# Patient Record
Sex: Female | Born: 1995 | Hispanic: Yes | Marital: Married | Smoking: Never smoker
Health system: Southern US, Community
[De-identification: ages and names within clinical notes are randomized; demographics above are authoritative.]

## PROBLEM LIST (undated history)

## (undated) HISTORY — PX: OTHER SURGICAL HISTORY: SHX169

---

## 2019-02-15 DIAGNOSIS — O039 Complete or unspecified spontaneous abortion without complication: Secondary | ICD-10-CM

## 2019-02-15 HISTORY — DX: Complete or unspecified spontaneous abortion without complication: O03.9

## 2019-03-02 ENCOUNTER — Other Ambulatory Visit: Payer: Self-pay

## 2019-03-02 DIAGNOSIS — Z20822 Contact with and (suspected) exposure to covid-19: Secondary | ICD-10-CM

## 2019-03-04 LAB — NOVEL CORONAVIRUS, NAA: SARS-CoV-2, NAA: NOT DETECTED

## 2021-03-09 ENCOUNTER — Other Ambulatory Visit: Payer: Self-pay

## 2021-03-09 ENCOUNTER — Emergency Department: Payer: Self-pay

## 2021-03-09 ENCOUNTER — Emergency Department
Admission: EM | Admit: 2021-03-09 | Discharge: 2021-03-09 | Disposition: A | Discharge status: Home / Self Care | Payer: Self-pay | Attending: Emergency Medicine | Admitting: Emergency Medicine

## 2021-03-09 DIAGNOSIS — Z3A01 Less than 8 weeks gestation of pregnancy: Secondary | ICD-10-CM | POA: Insufficient documentation

## 2021-03-09 DIAGNOSIS — O200 Threatened abortion: Secondary | ICD-10-CM | POA: Insufficient documentation

## 2021-03-09 LAB — CBC
HCT: 39.1 % (ref 36.0–46.0)
Hemoglobin: 13.6 g/dL (ref 12.0–15.0)
MCH: 30.4 pg (ref 26.0–34.0)
MCHC: 34.8 g/dL (ref 30.0–36.0)
MCV: 87.3 fL (ref 80.0–100.0)
Platelets: 326 10*3/uL (ref 150–400)
RBC: 4.48 MIL/uL (ref 3.87–5.11)
RDW: 13.3 % (ref 11.5–15.5)
WBC: 14.3 10*3/uL — ABNORMAL HIGH (ref 4.0–10.5)
nRBC: 0 % (ref 0.0–0.2)

## 2021-03-09 LAB — ABO/RH: ABO/RH(D): O POS

## 2021-03-09 LAB — HCG, QUANTITATIVE, PREGNANCY: hCG, Beta Chain, Quant, S: 48844 m[IU]/mL — ABNORMAL HIGH (ref <5)

## 2021-03-09 LAB — POC URINE PREG, ED: Preg Test, Ur: POSITIVE — AB

## 2021-03-09 IMAGING — US US OB COMP LESS 14 WK
1 series · 15 of 28 positions shown · non-contrast
Comparison: None.

CLINICAL DATA: Vaginal bleeding, serum beta hCG of 48,844

EXAM:
OBSTETRIC <14 WK ULTRASOUND
TECHNIQUE: Transabdominal ultrasound was performed for evaluation of the
gestation as well as the maternal uterus and adnexal regions.

[Series 1: us ob comp less 14 wks · 15 of 40 slices shown]
[im 1/40]
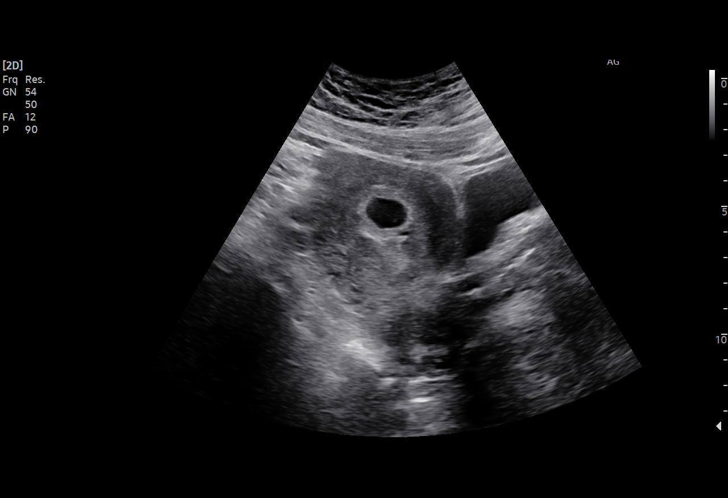
[im 3/40]
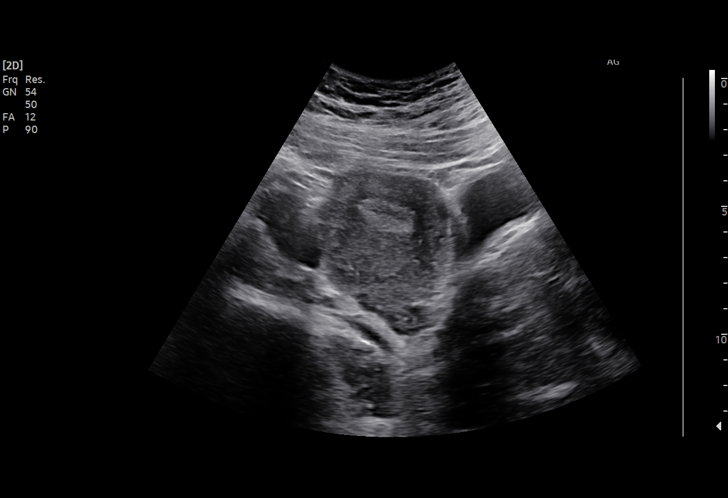
[im 6/40]
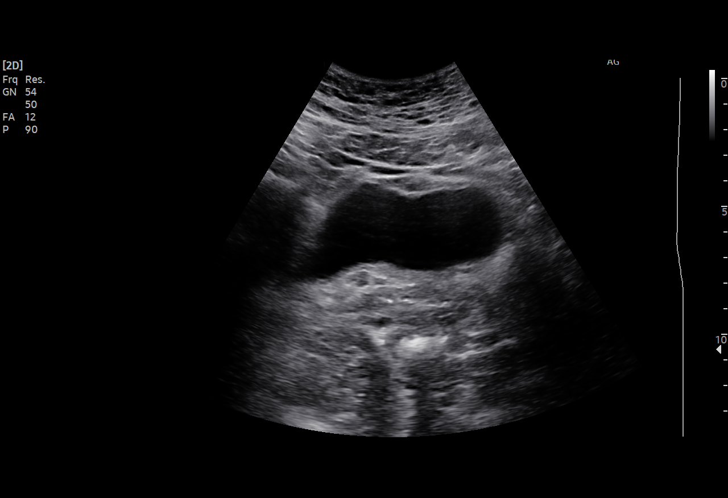
[im 9/40]
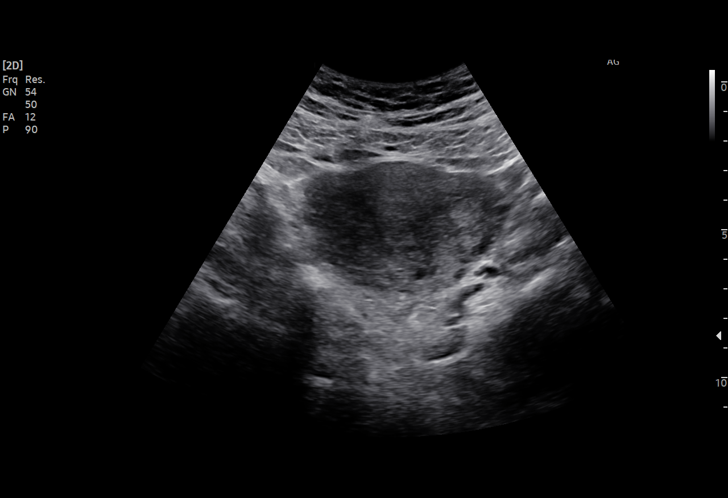
[im 12/40]
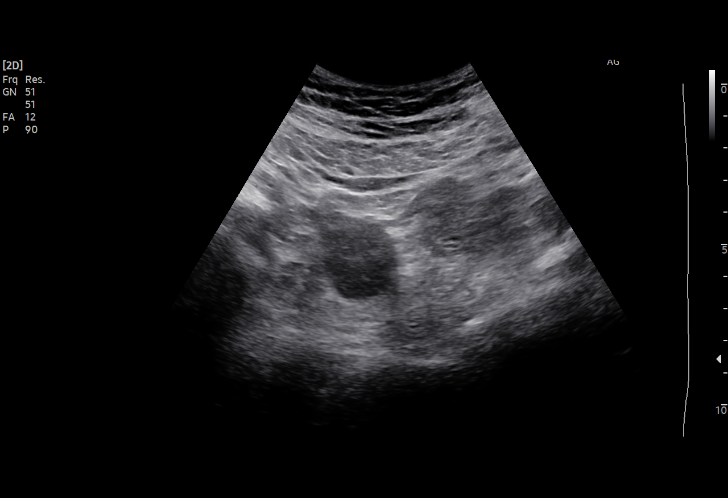
[im 15/40]
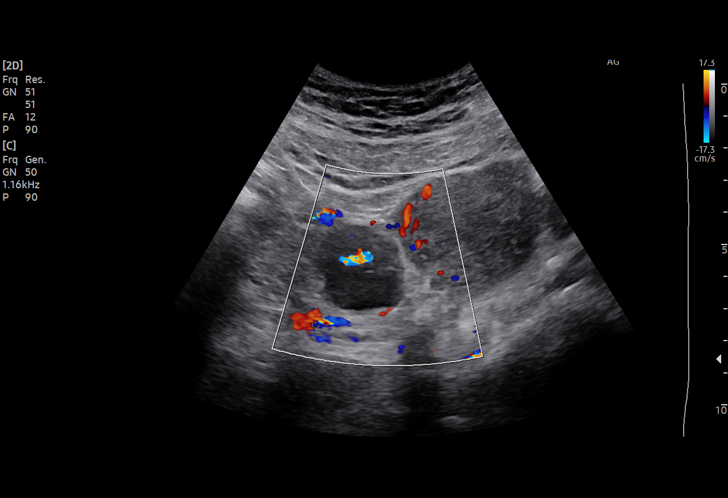
[im 18/40]
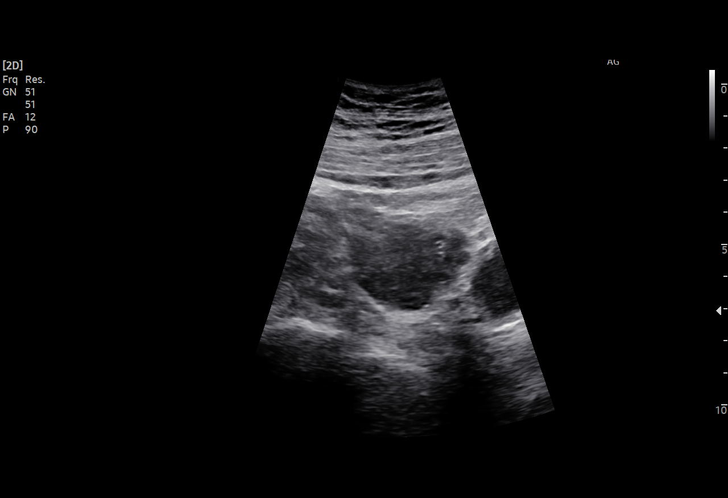
[im 21/40]
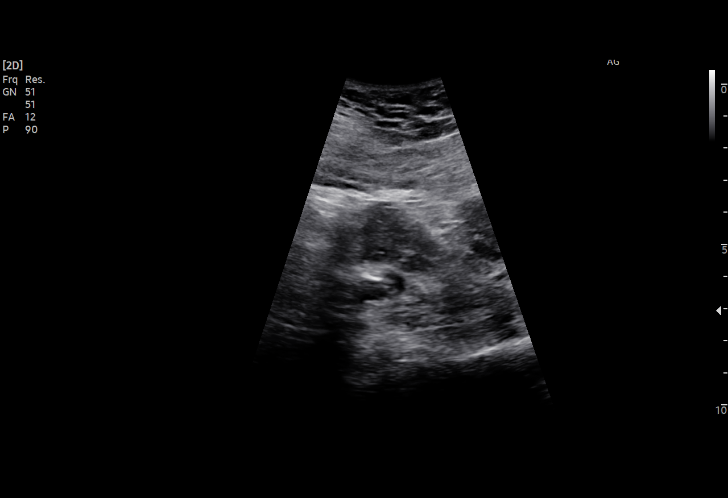
[im 22/40]
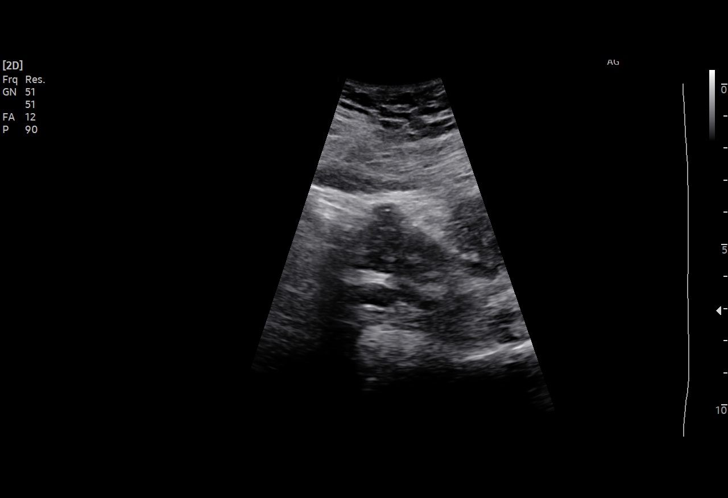
[im 25/40]
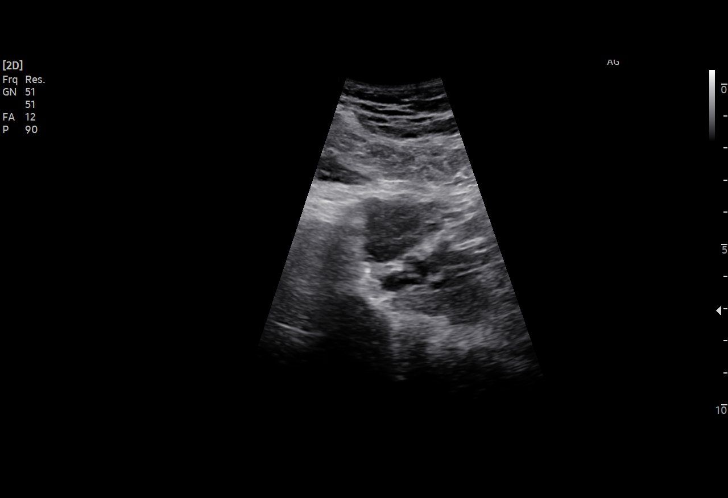
[im 28/40]
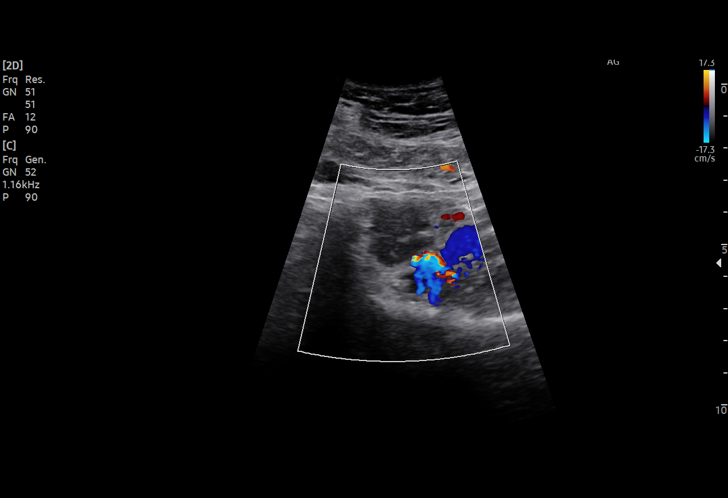
[im 31/40]
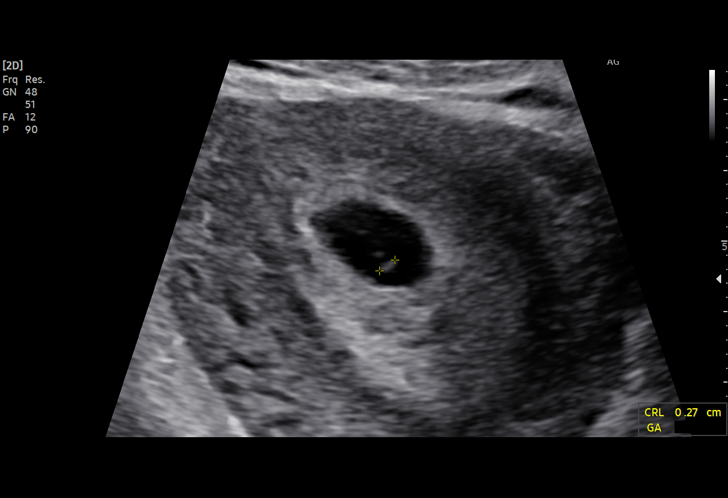
[im 34/40]
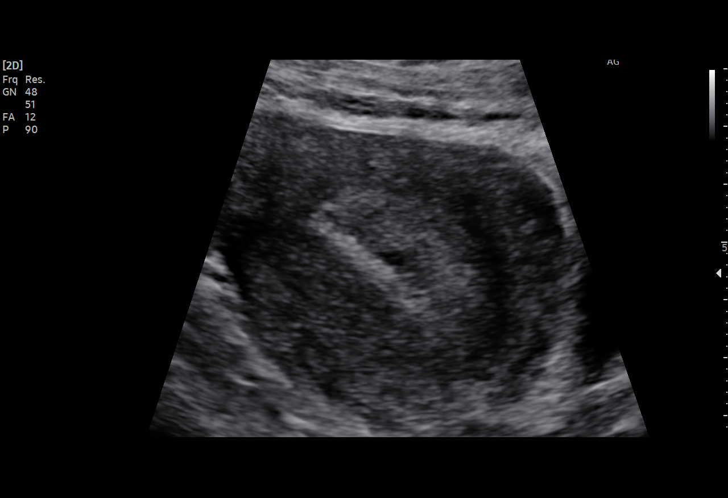
[im 37/40]
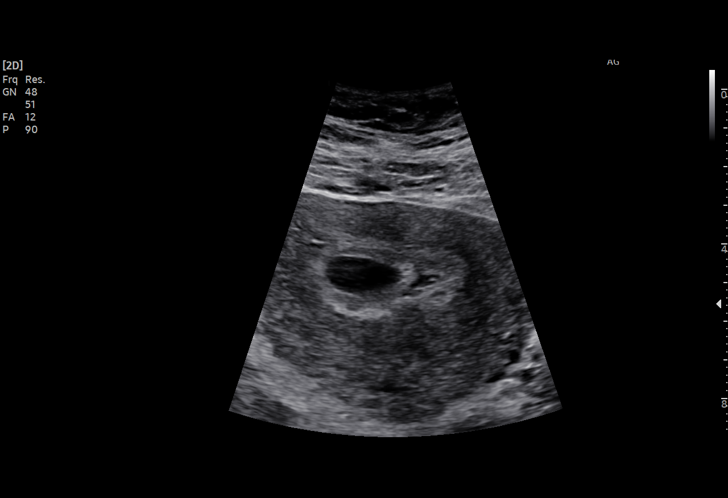
[im 40/40]
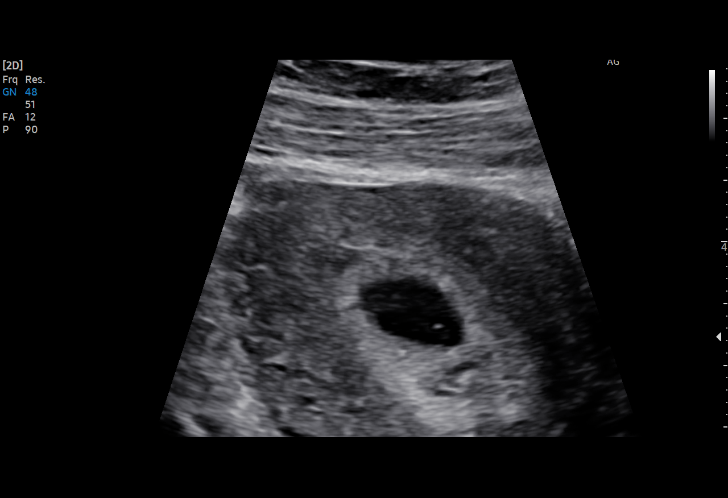

[15 of 28 positions shown; findings below may reference images not displayed]

FINDINGS: Intrauterine gestational sac: Single

Yolk sac:  Visualized.

Embryo:  Visualized.

Cardiac Activity: Visualized.

Heart Rate: 130 bpm

CRL:   3 mm   5 w 6 d                  US EDC: [DATE]

Subchorionic hemorrhage:  Small bowel.

Maternal uterus/adnexae: Bilateral ovaries are unremarkable. No
pelvic free fluid.
IMPRESSION: Single viable early intrauterine pregnancy with small volume
subchorionic hemorrhage.

## 2021-03-09 NOTE — ED Triage Notes (Signed)
Pt comes with c/o vaginal bleeding and cramping. Pt states positive preg test at home. Pt stats light bleeding pt unsure how far along.

## 2021-03-09 NOTE — ED Provider Notes (Signed)
[facility] Emergency Department Provider Note   ____________________________________________   None    (approximate)  I have reviewed the triage vital signs and the nursing notes.   HISTORY  Chief Complaint Vaginal Bleeding    HPI Leslie Powell is a 25 y.o. female with no significant past medical history who presents to the ED complaining of vaginal bleeding.  Patient recently found out she is pregnant and she is not sure how far along she has.  She developed cramping in her lower abdomen last night, this morning started to notice some vaginal bleeding.  She describes the bleeding as light spotting and the cramping seems to have improved from last night.  She complains of mild nausea, but has not had any vomiting or changes in bowel movements.  She denies any discharge, dysuria, fever, or flank pain.  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  She has her first OB/GYN appointment scheduled for next week at the [place] [facility].        History reviewed. No pertinent past medical history.  There are no problems to display for this patient.   History reviewed. No pertinent surgical history.  Prior to Admission medications   Not on File    Allergies Patient has no allergy information on record.  No family history on file.  Social History    Review of Systems  Constitutional: No fever/chills Eyes: No visual changes. ENT: No sore throat. Cardiovascular: Denies chest pain. Respiratory: Denies shortness of breath. Gastrointestinal: Positive for abdominal pain and nausea, no vomiting.  No diarrhea.  No constipation. Genitourinary: Negative for dysuria.  Positive for vaginal bleeding. Musculoskeletal: Negative for back pain. Skin: Negative for rash. Neurological: Negative for headaches, focal weakness or numbness.  ____________________________________________   PHYSICAL EXAM:  VITAL SIGNS: ED  Triage Vitals  Enc Vitals Group     BP 03/09/21 1021 133/83     Pulse Rate 03/09/21 1021 81     Resp 03/09/21 1021 18     Temp 03/09/21 1021 97.7 F (36.5 C)     Temp Source 03/09/21 1021 Oral     SpO2 03/09/21 1021 98 %     Weight --      Height --      Head Circumference --      Peak Flow --      Pain Score 03/09/21 1020 4     Pain Loc --      Pain Edu? --      Excl. in GC? --     Constitutional: Alert and oriented. Eyes: Conjunctivae are normal. Head: Atraumatic. Nose: No congestion/rhinnorhea. Mouth/Throat: Mucous membranes are moist. Neck: Normal ROM Cardiovascular: Normal rate, regular rhythm. Grossly normal heart sounds.  2+ radial pulses bilaterally. Respiratory: Normal respiratory effort.  No retractions. Lungs CTAB. Gastrointestinal: Soft and nontender. No distention. Genitourinary: deferred Musculoskeletal: No lower extremity tenderness nor edema. Neurologic:  Normal speech and language. No gross focal neurologic deficits are appreciated. Skin:  Skin is warm, dry and intact. No rash noted. Psychiatric: Mood and affect are normal. Speech and behavior are normal.  ____________________________________________   LABS (all labs ordered are listed, but only abnormal results are displayed)  Labs Reviewed  CBC - Abnormal; Notable for the following components:      Result Value   WBC 14.3 (*)    All other components within normal limits  HCG, QUANTITATIVE, PREGNANCY - Abnormal; Notable for the following components:  hCG, Beta Chain, Quant, [place] 10,258 (*)    All other components within normal limits  POC URINE PREG, ED - Abnormal; Notable for the following components:   Preg Test, Ur POSITIVE (*)    All other components within normal limits  ABO/RH    PROCEDURES  Procedure(s) performed (including Critical Care):  Procedures   ____________________________________________   INITIAL IMPRESSION / ASSESSMENT AND PLAN / ED COURSE      25 year old female  with no significant past medical history presents to the ED complaining of pelvic cramping and vaginal bleeding developing overnight into this morning.  Pregnancy testing is positive and ultrasound shows intrauterine pregnancy at 5 weeks and 6 days with small subchorionic hemorrhage.  H&H is stable and patient describes bleeding as mild currently, she is Rh+ and there is no indication for RhoGAM.  She has no abdominal tenderness currently.  Given reassuring work-up, she is appropriate for discharge home with OB/GYN follow-up.  She was counseled to return to the ED for new worsening symptoms, patient agrees with plan.      ____________________________________________   FINAL CLINICAL IMPRESSION(S) / ED DIAGNOSES  Final diagnoses:  Threatened miscarriage     ED Discharge Orders     None        Note:  This document was prepared using Dragon voice recognition software and may include unintentional dictation errors.    Chesley Noon, MD 03/09/21 1352

## 2021-04-18 ENCOUNTER — Emergency Department
Admission: EM | Admit: 2021-04-18 | Discharge: 2021-04-18 | Disposition: A | Discharge status: Home / Self Care | Payer: Self-pay | Attending: Emergency Medicine | Admitting: Emergency Medicine

## 2021-04-18 ENCOUNTER — Other Ambulatory Visit: Payer: Self-pay

## 2021-04-18 ENCOUNTER — Emergency Department: Payer: Self-pay

## 2021-04-18 DIAGNOSIS — E876 Hypokalemia: Secondary | ICD-10-CM | POA: Insufficient documentation

## 2021-04-18 DIAGNOSIS — M549 Dorsalgia, unspecified: Secondary | ICD-10-CM

## 2021-04-18 DIAGNOSIS — Z3A12 12 weeks gestation of pregnancy: Secondary | ICD-10-CM | POA: Insufficient documentation

## 2021-04-18 DIAGNOSIS — R109 Unspecified abdominal pain: Secondary | ICD-10-CM | POA: Insufficient documentation

## 2021-04-18 DIAGNOSIS — Z20822 Contact with and (suspected) exposure to covid-19: Secondary | ICD-10-CM | POA: Insufficient documentation

## 2021-04-18 DIAGNOSIS — R079 Chest pain, unspecified: Secondary | ICD-10-CM | POA: Insufficient documentation

## 2021-04-18 DIAGNOSIS — R0602 Shortness of breath: Secondary | ICD-10-CM | POA: Insufficient documentation

## 2021-04-18 DIAGNOSIS — O2691 Pregnancy related conditions, unspecified, first trimester: Secondary | ICD-10-CM | POA: Insufficient documentation

## 2021-04-18 DIAGNOSIS — M545 Low back pain, unspecified: Secondary | ICD-10-CM | POA: Insufficient documentation

## 2021-04-18 LAB — RESP PANEL BY RT-PCR (FLU A&B, COVID) ARPGX2
Influenza A by PCR: NEGATIVE
Influenza B by PCR: NEGATIVE
SARS Coronavirus 2 by RT PCR: NEGATIVE

## 2021-04-18 LAB — BASIC METABOLIC PANEL
Anion gap: 10 (ref 5–15)
BUN: 9 mg/dL (ref 6–20)
CO2: 20 mmol/L — ABNORMAL LOW (ref 22–32)
Calcium: 9.1 mg/dL (ref 8.9–10.3)
Chloride: 104 mmol/L (ref 98–111)
Creatinine, Ser: 0.42 mg/dL — ABNORMAL LOW (ref 0.44–1.00)
GFR, Estimated: >60 mL/min (ref >60)
Glucose, Bld: 116 mg/dL — ABNORMAL HIGH (ref 70–99)
Potassium: 3.4 mmol/L — ABNORMAL LOW (ref 3.5–5.1)
Sodium: 134 mmol/L — ABNORMAL LOW (ref 135–145)

## 2021-04-18 LAB — TROPONIN I (HIGH SENSITIVITY): Troponin I (High Sensitivity): <2 ng/L (ref <18)

## 2021-04-18 LAB — URINALYSIS, COMPLETE (UACMP) WITH MICROSCOPIC
Bacteria, UA: NONE SEEN
Bilirubin Urine: NEGATIVE
Glucose, UA: NEGATIVE mg/dL
Hgb urine dipstick: NEGATIVE
Ketones, ur: NEGATIVE mg/dL
Leukocytes,Ua: NEGATIVE
Nitrite: NEGATIVE
Protein, ur: NEGATIVE mg/dL
Specific Gravity, Urine: 1.004 — ABNORMAL LOW (ref 1.005–1.030)
pH: 9 — ABNORMAL HIGH (ref 5.0–8.0)

## 2021-04-18 LAB — CBC
HCT: 37.5 % (ref 36.0–46.0)
Hemoglobin: 12.7 g/dL (ref 12.0–15.0)
MCH: 29.3 pg (ref 26.0–34.0)
MCHC: 33.9 g/dL (ref 30.0–36.0)
MCV: 86.4 fL (ref 80.0–100.0)
Platelets: 316 10*3/uL (ref 150–400)
RBC: 4.34 MIL/uL (ref 3.87–5.11)
RDW: 13.3 % (ref 11.5–15.5)
WBC: 17.5 10*3/uL — ABNORMAL HIGH (ref 4.0–10.5)
nRBC: 0 % (ref 0.0–0.2)

## 2021-04-18 LAB — PROCALCITONIN: Procalcitonin: <0.1 ng/mL

## 2021-04-18 LAB — HEPATIC FUNCTION PANEL
ALT: 23 U/L (ref 0–44)
AST: 28 U/L (ref 15–41)
Albumin: 3.5 g/dL (ref 3.5–5.0)
Alkaline Phosphatase: 45 U/L (ref 38–126)
Bilirubin, Direct: 0.1 mg/dL (ref 0.0–0.2)
Indirect Bilirubin: 0.5 mg/dL (ref 0.3–0.9)
Total Bilirubin: 0.6 mg/dL (ref 0.3–1.2)
Total Protein: 6.9 g/dL (ref 6.5–8.1)

## 2021-04-18 LAB — LIPASE, BLOOD: Lipase: 25 U/L (ref 11–51)

## 2021-04-18 LAB — HCG, QUANTITATIVE, PREGNANCY: hCG, Beta Chain, Quant, S: 160511 m[IU]/mL — ABNORMAL HIGH (ref <5)

## 2021-04-18 LAB — D-DIMER, QUANTITATIVE: D-Dimer, Quant: 0.5 ug/mL-FEU (ref 0.00–0.50)

## 2021-04-18 IMAGING — US US OB COMP LESS 14 WK
1 series · 14 of 28 positions shown · non-contrast
Comparison: [DATE]

CLINICAL DATA: . lower abdominal pain with known intrauterine
gestation.

EXAM:
OBSTETRIC <14 WK US AND TRANSVAGINAL OB US
TECHNIQUE: Both transabdominal and transvaginal ultrasound examinations were
performed for complete evaluation of the gestation as well as the
maternal uterus, adnexal regions, and pelvic cul-de-sac.
Transvaginal technique was performed to assess early pregnancy.

[Series 1: us ob comp less 14 wks · 14 of 45 slices shown]
[im 2/45]
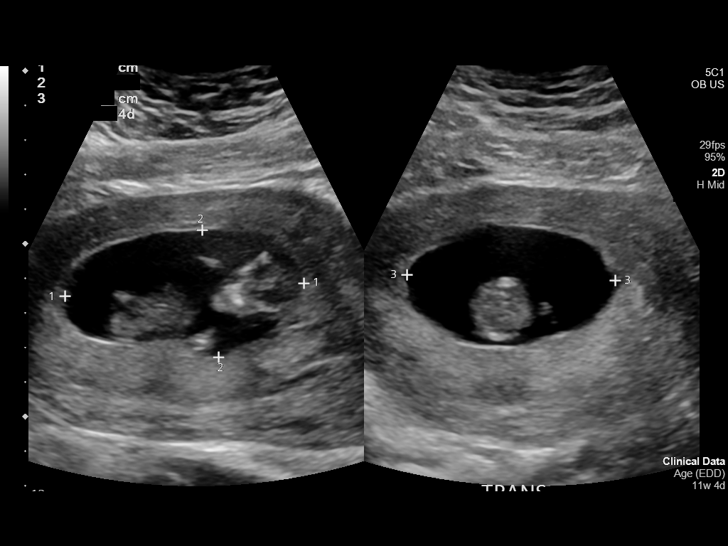
[im 5/45]
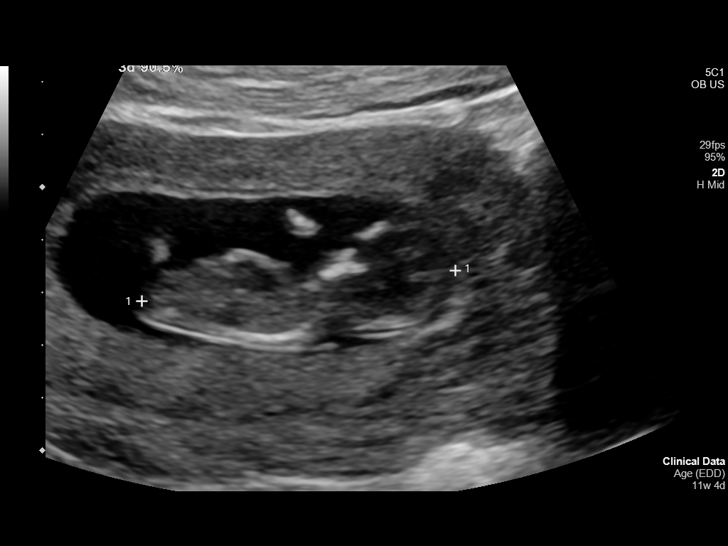
[im 9/45]
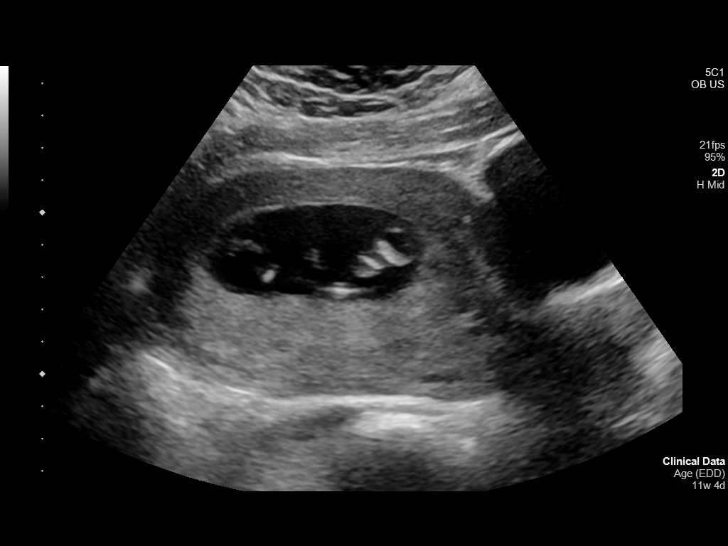
[im 12/45]
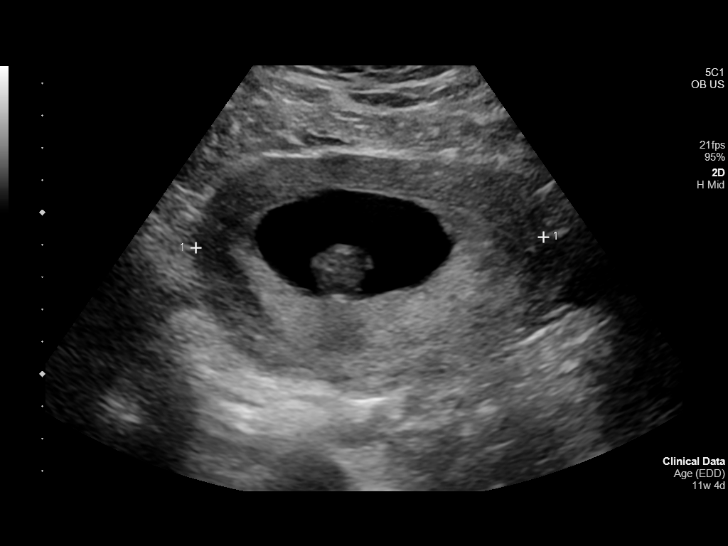
[im 15/45]
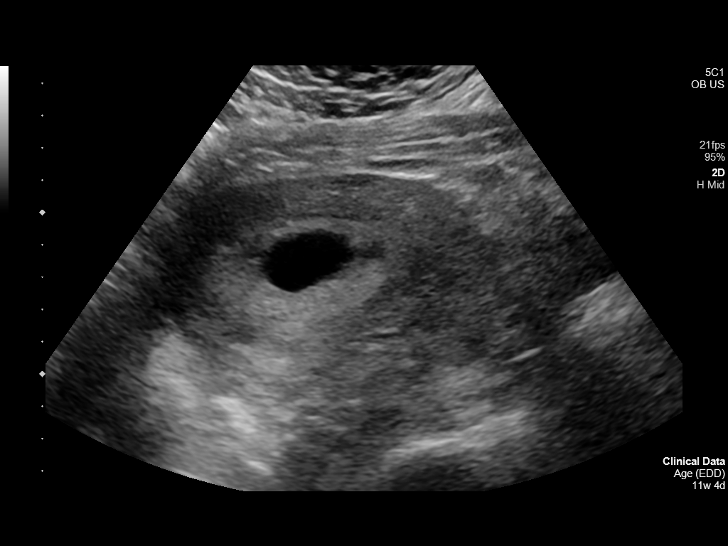
[im 18/45]
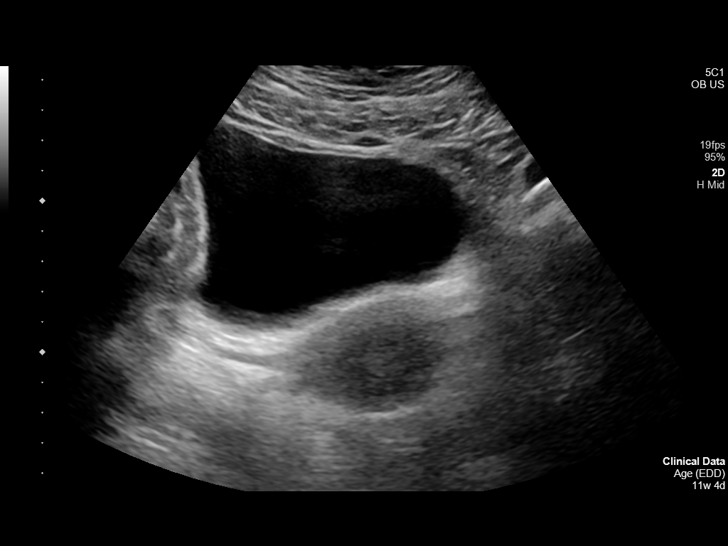
[im 22/45]
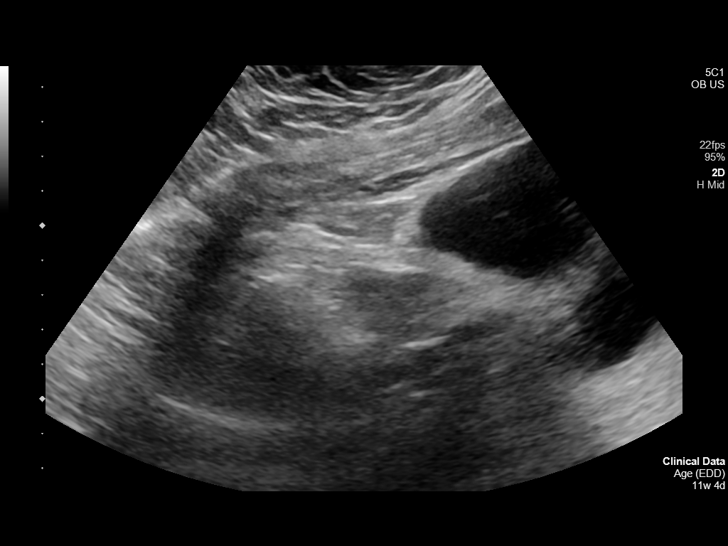
[im 25/45]
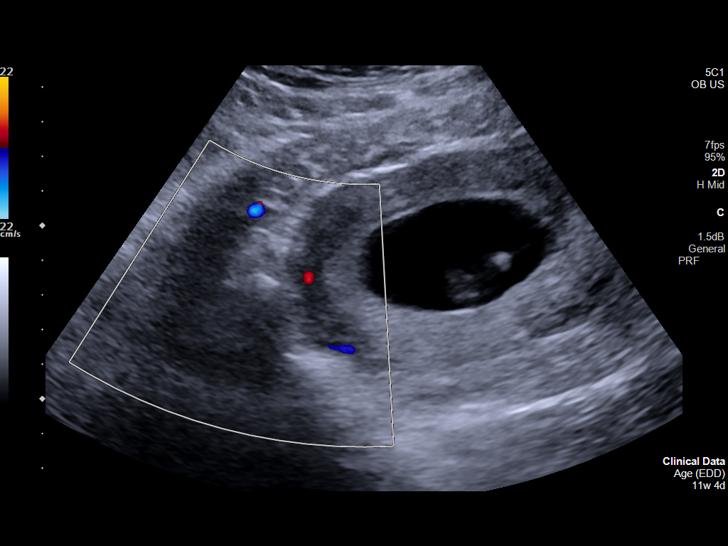
[im 28/45]
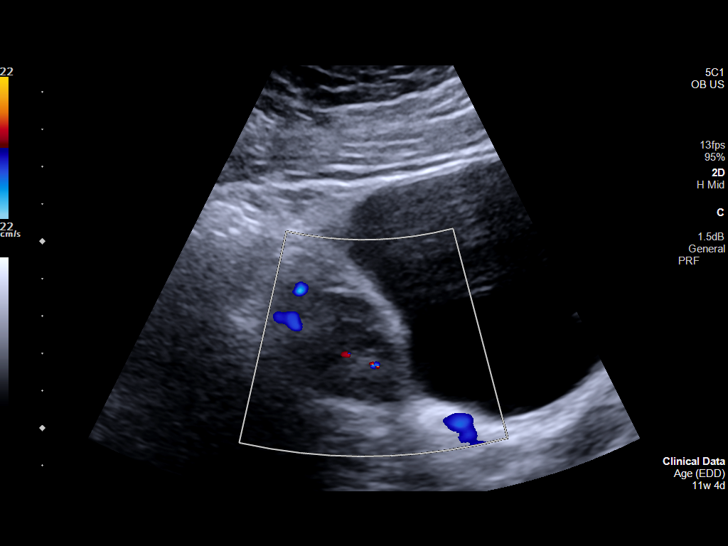
[im 31/45]
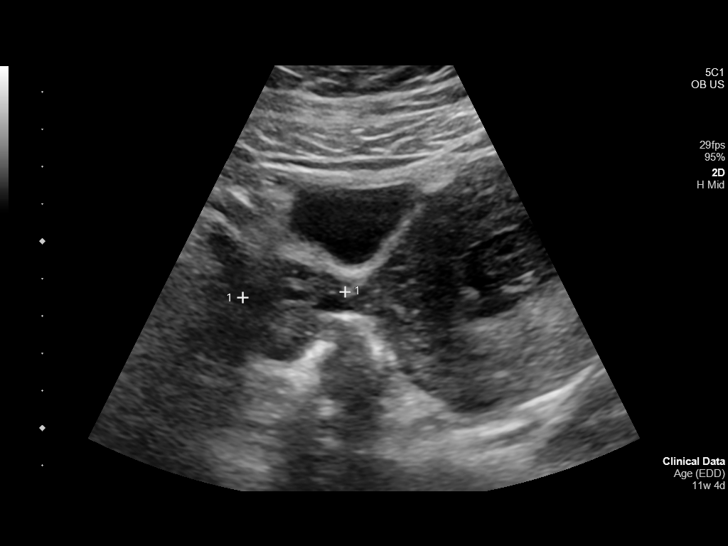
[im 35/45]
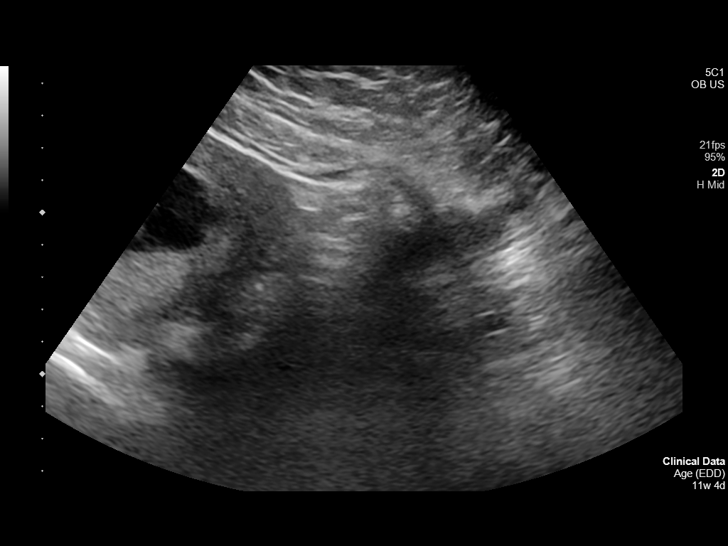
[im 38/45]
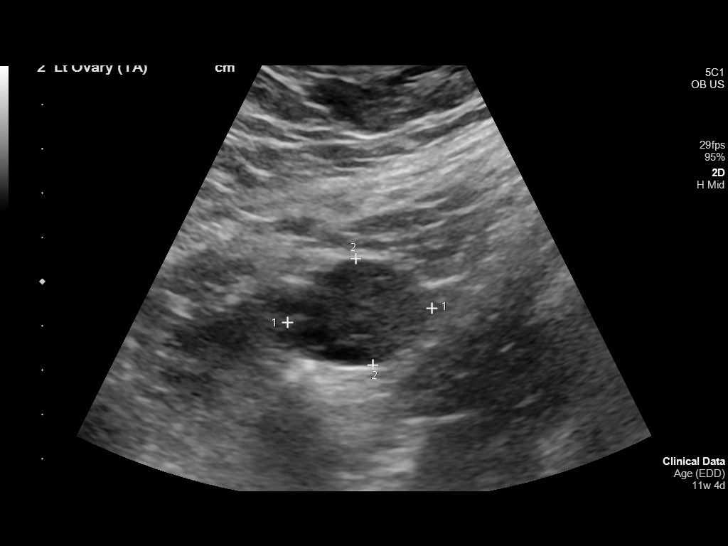
[im 41/45]
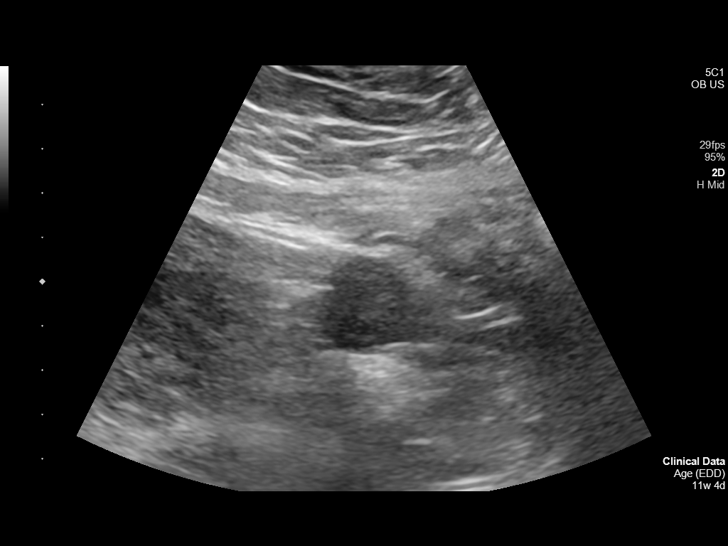
[im 45/45]
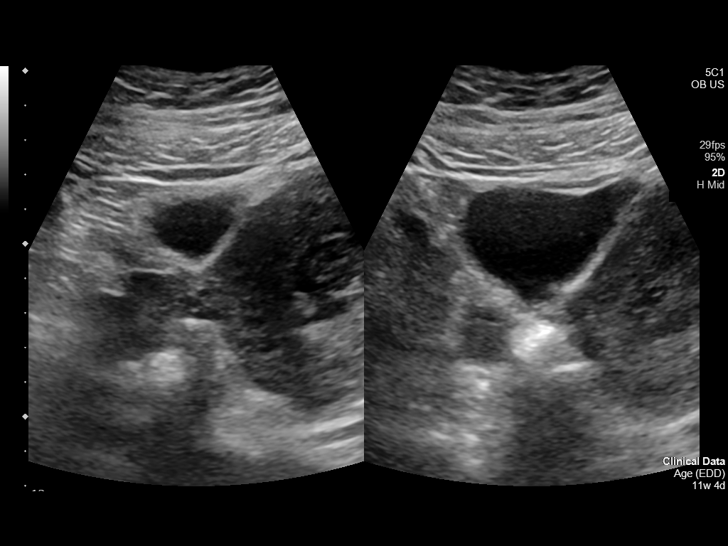

[14 of 28 positions shown; findings below may reference images not displayed]

FINDINGS: Intrauterine gestational sac: Single

Yolk sac:  Not visualized

Embryo:  Visualized

Cardiac Activity: Visualized

Heart Rate: 160 bpm

CRL:  59.1 mm   12 w   3 d                  US EDC: [DATE]

Subchorionic hemorrhage:  None visualized.

Maternal uterus/adnexae: Maternal ovaries unremarkable. No
intraperitoneal free fluid.
IMPRESSION: Single living intrauterine gestation at estimated 12 week 3 day
gestational age by crown-rump length.

## 2021-04-18 IMAGING — US US ABDOMEN LIMITED
1 series · 14 of 25 positions shown · non-contrast
Comparison: None.

CLINICAL DATA: Chest pain, nausea, shortness of breath.

EXAM:
ULTRASOUND ABDOMEN LIMITED RIGHT UPPER QUADRANT

[Series 1: us abdomen limited ruq (liver/gb) · 14 of 52 slices shown]
[im 1/52]
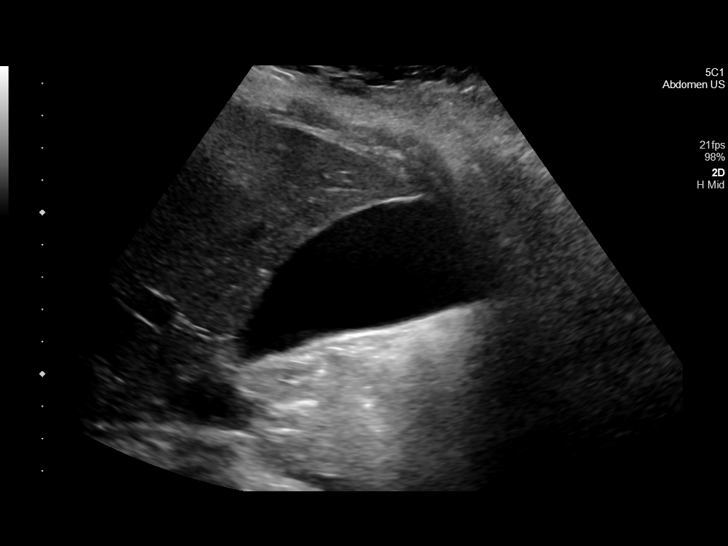
[im 5/52]
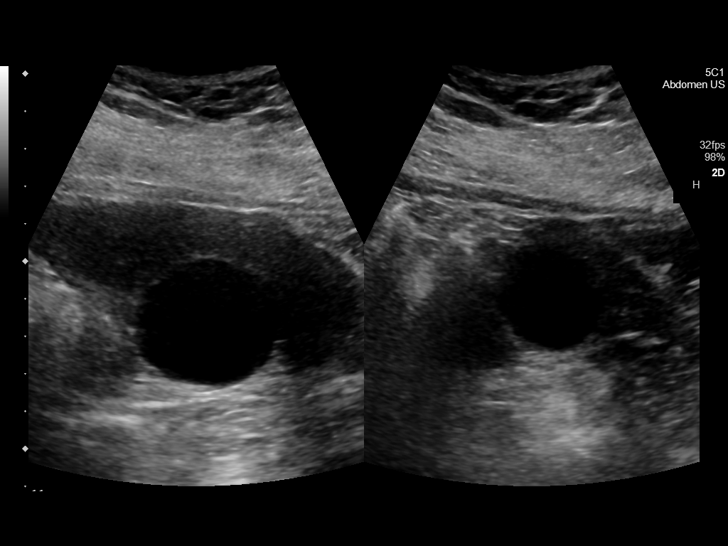
[im 9/52]
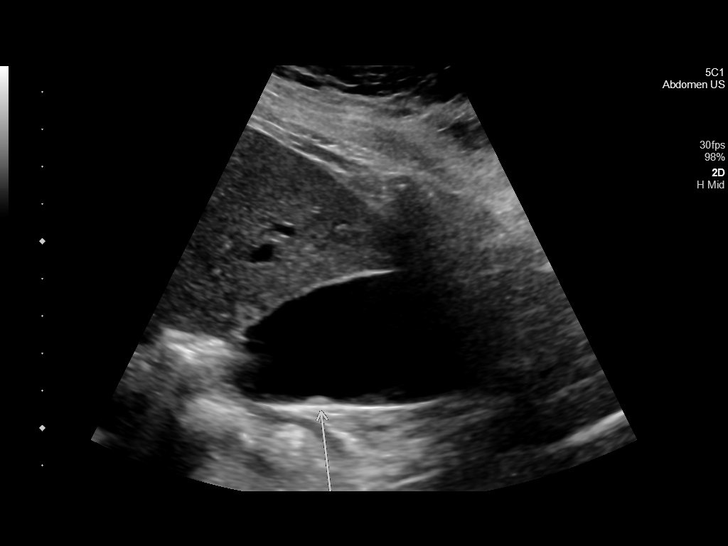
[im 13/52]
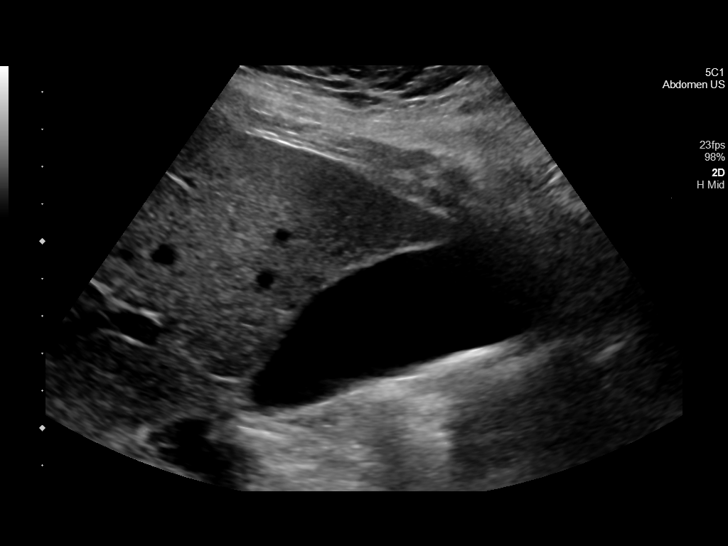
[im 18/52]
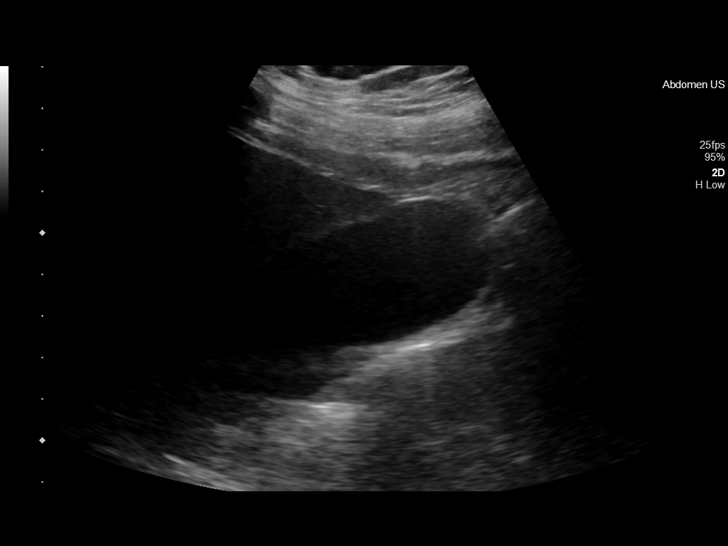
[im 20/52]
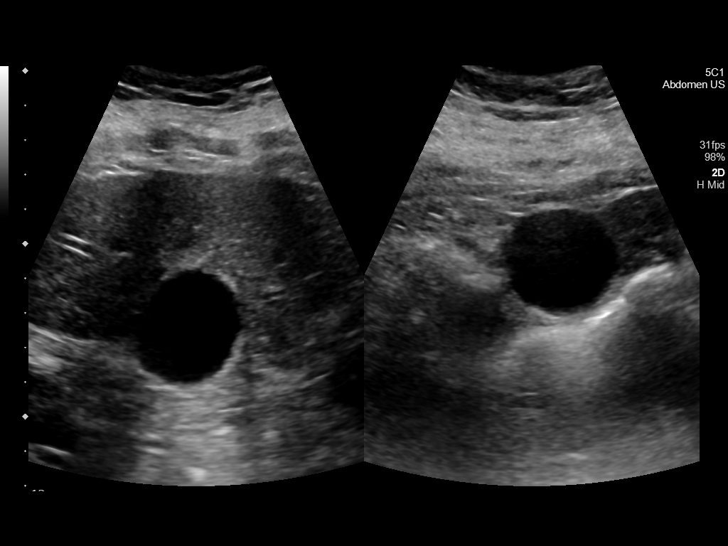
[im 24/52]
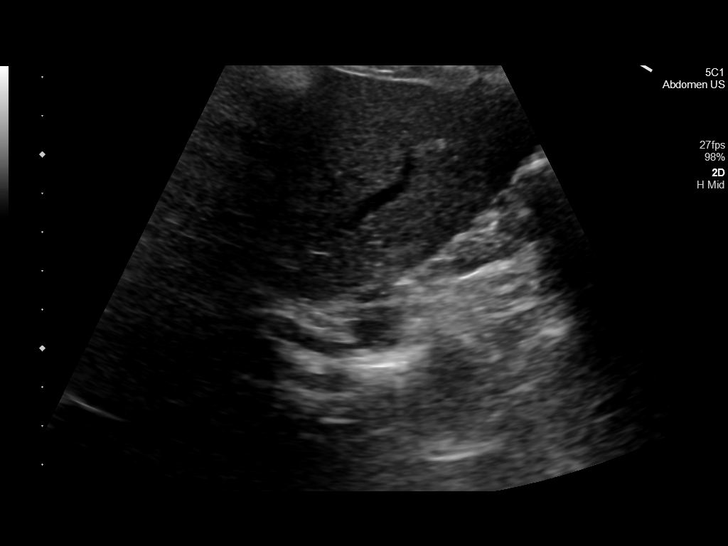
[im 28/52]
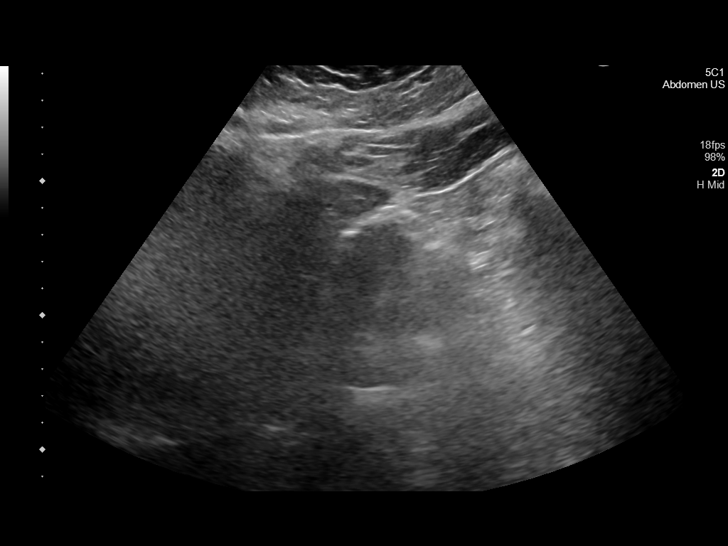
[im 32/52]
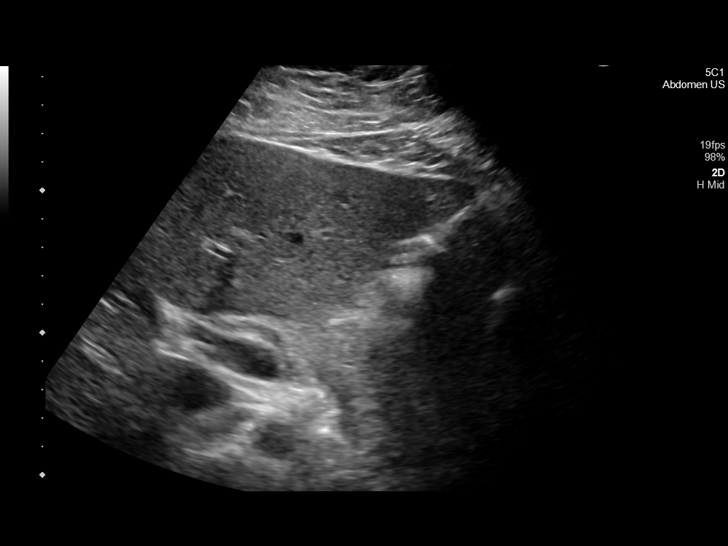
[im 35/52]
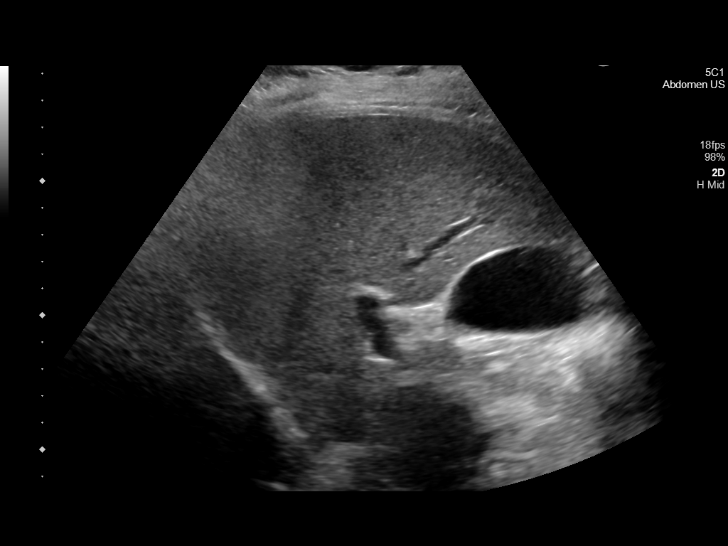
[im 39/52]
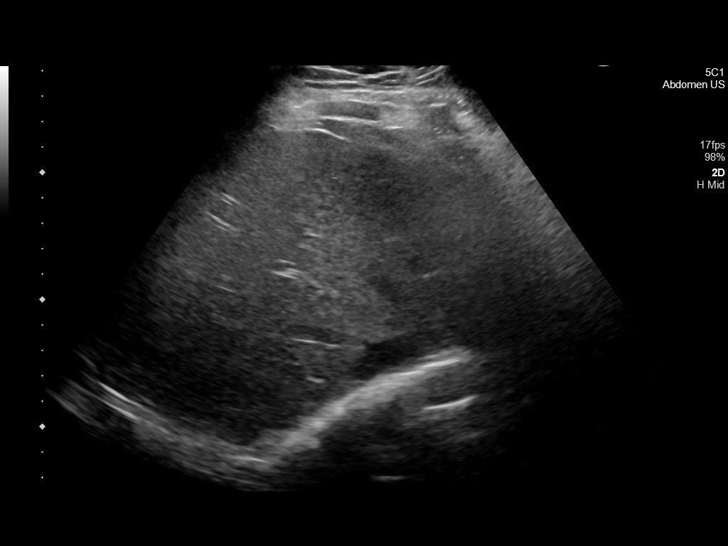
[im 43/52]
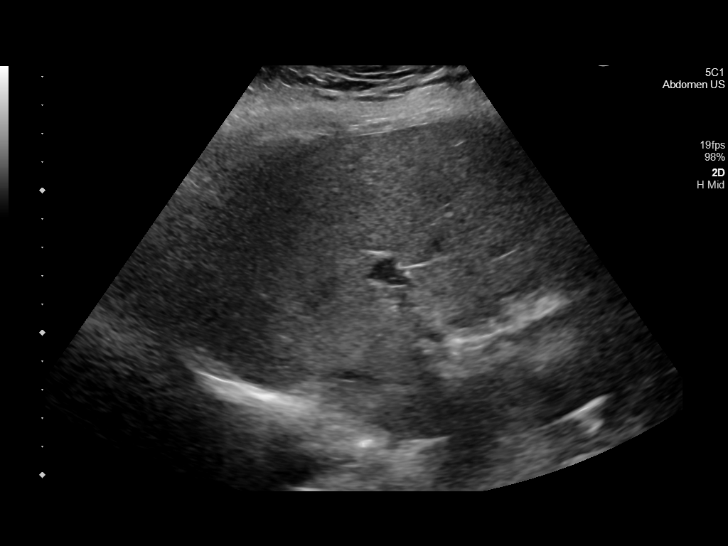
[im 47/52]
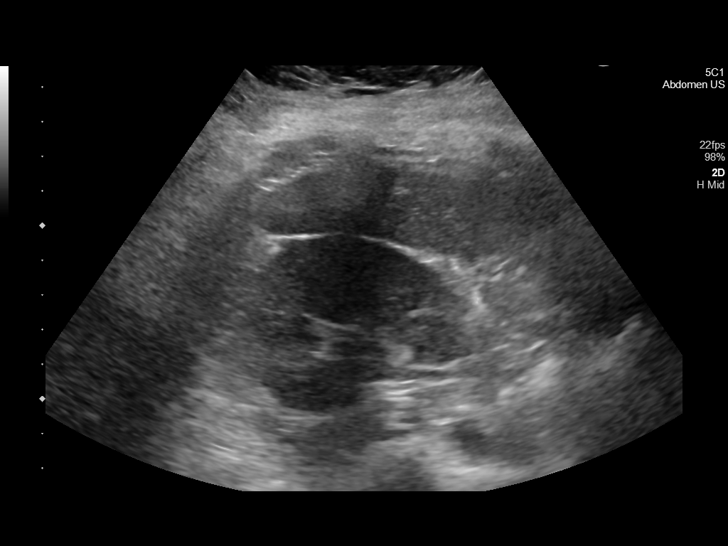
[im 52/52]
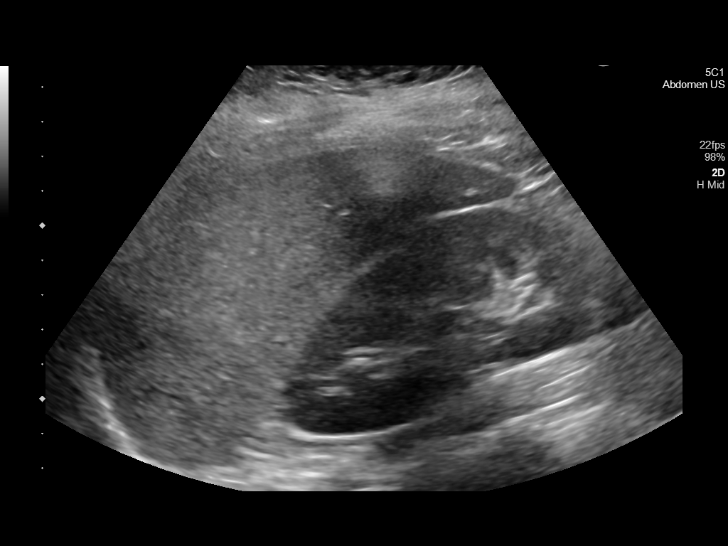

[14 of 25 positions shown; findings below may reference images not displayed]

FINDINGS: Gallbladder:

Small amount of sludge within the gallbladder. No gallstones seen.
No gallbladder wall thickening or pericholecystic fluid. No
sonographic Murphy's sign.

Common bile duct:

Diameter: 5 mm

Liver:

No focal lesion identified. Within normal limits in parenchymal
echogenicity. Portal vein is patent on color Doppler imaging with
normal direction of blood flow towards the liver.

Other: None.
IMPRESSION: 1. No acute findings. No evidence of cholecystitis. No gallstones.
No bile duct dilatation.
2. Small amount of sludge in the gallbladder.

## 2021-04-18 IMAGING — CR DG CHEST 2V
2 series · 2 of 2 positions shown · non-contrast
Comparison: No comparison studies available.

CLINICAL DATA: Chest pain and shortness of breath.

EXAM:
CHEST - 2 VIEW

[chest pa]
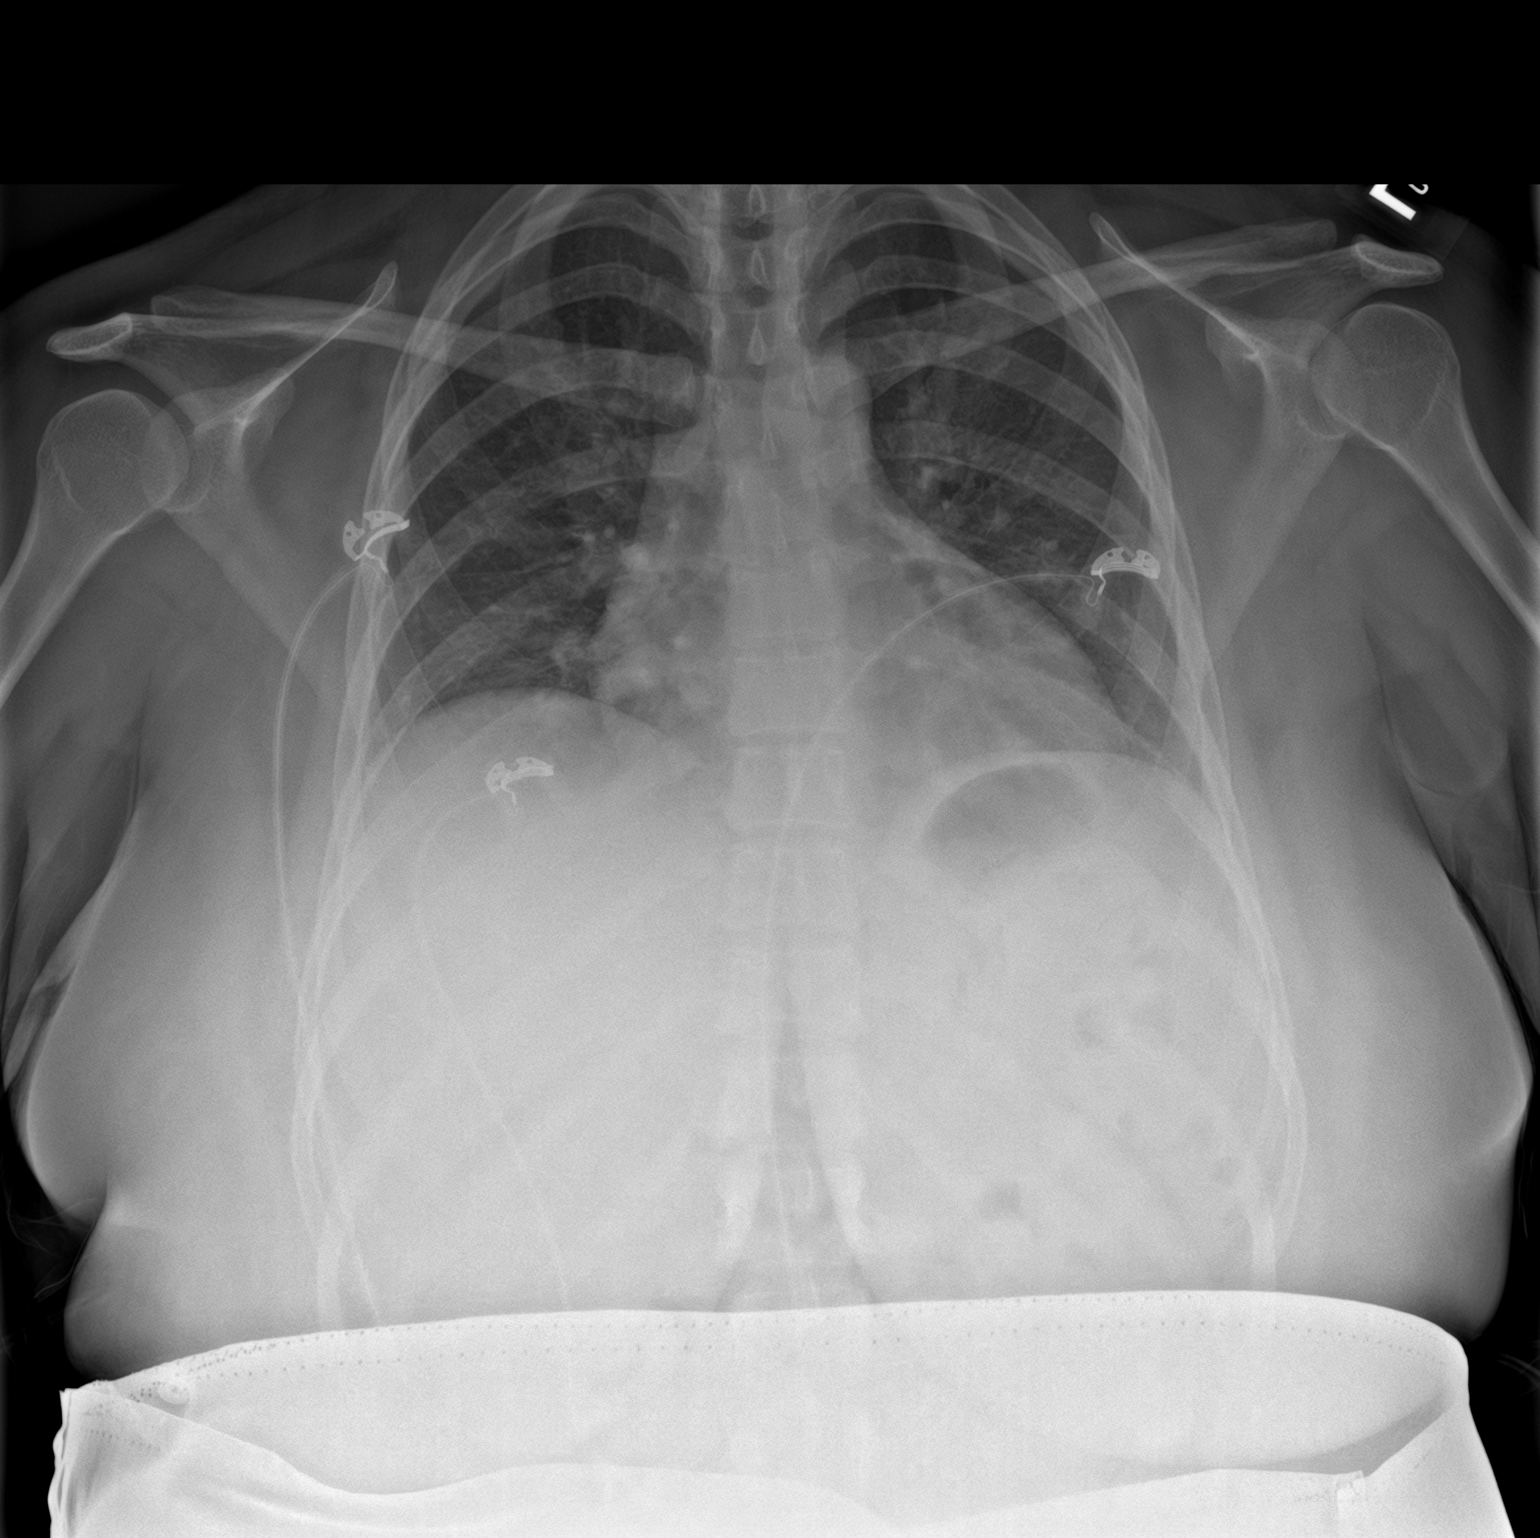

[chest lat]
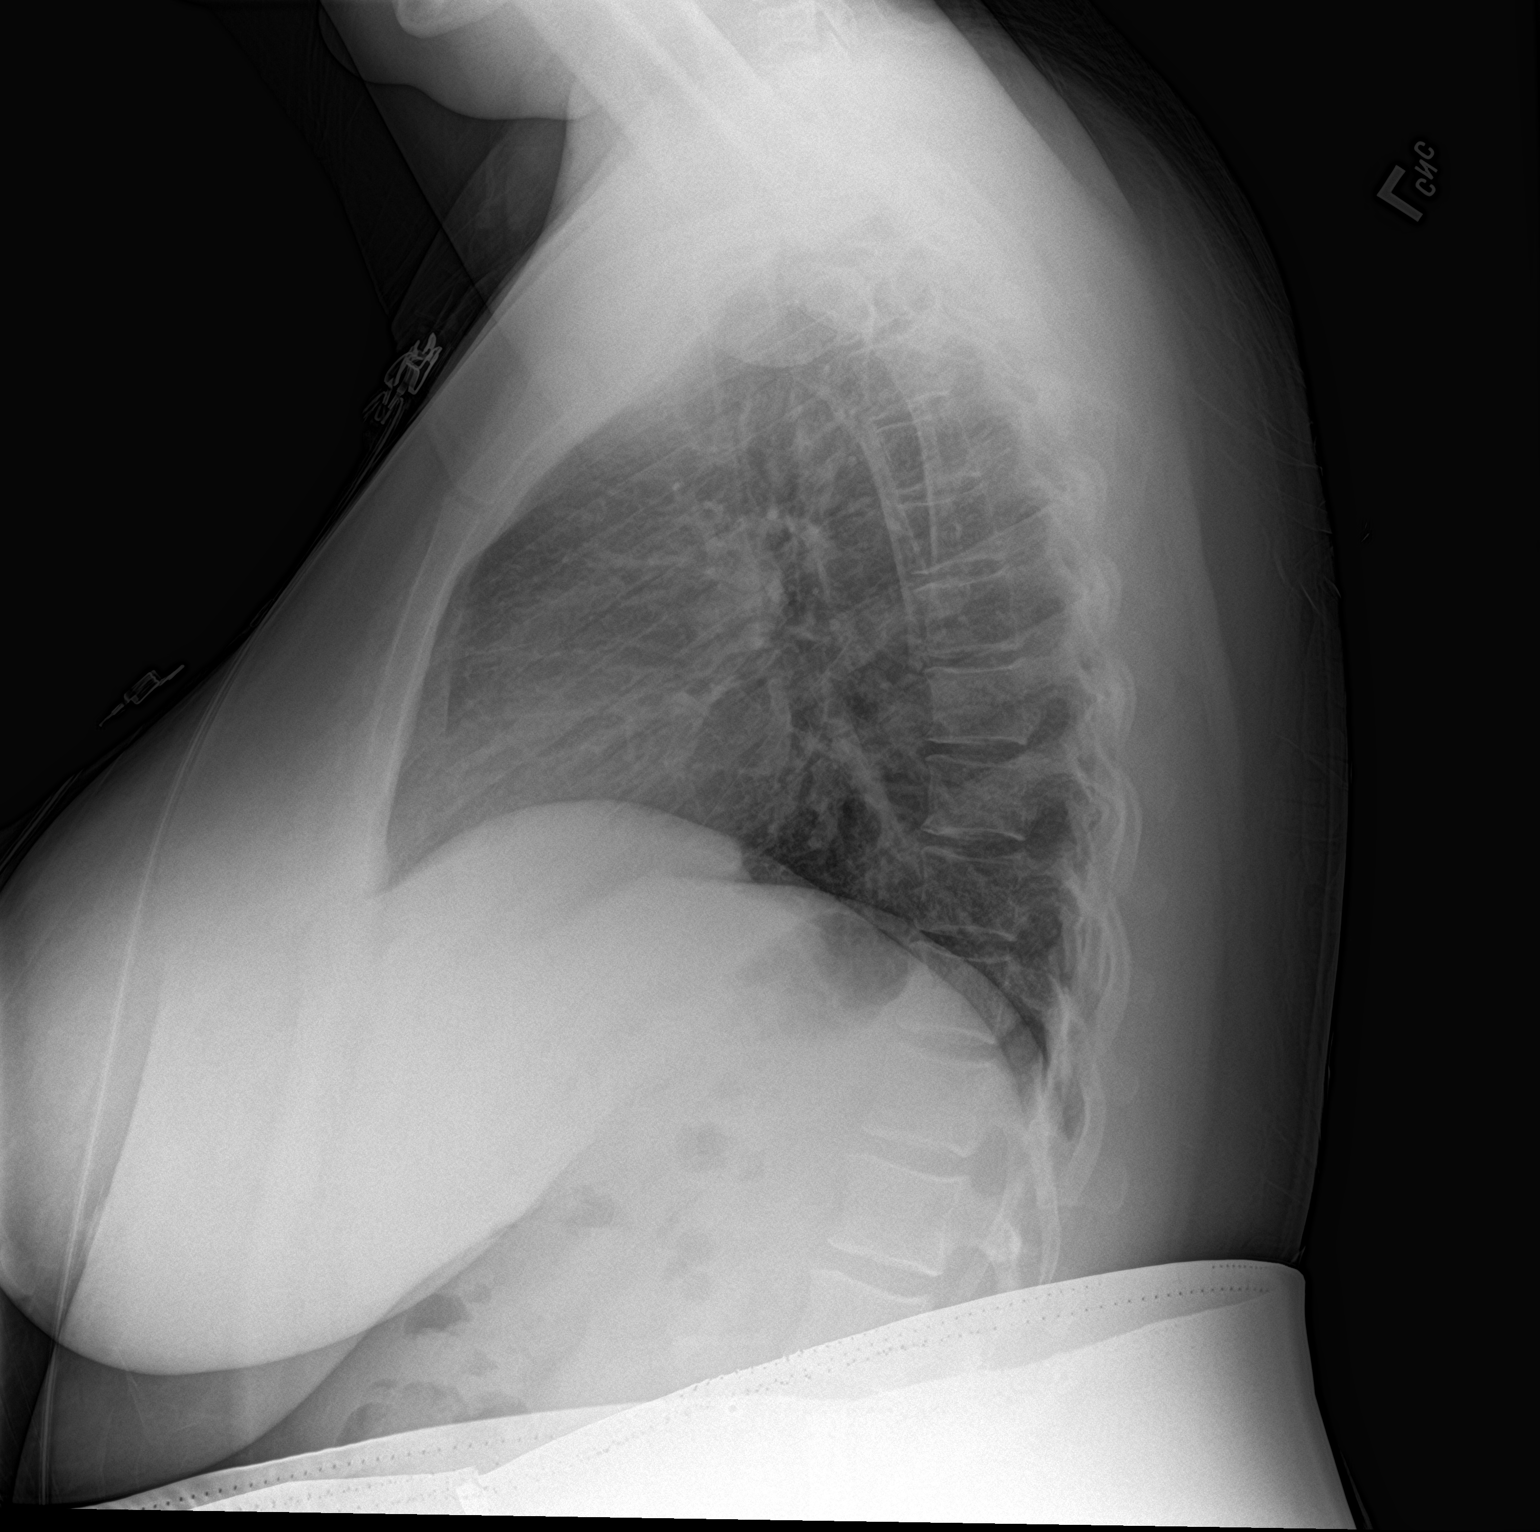

[2 of 2 positions shown; findings below may reference images not displayed]

FINDINGS: The lungs are clear without focal pneumonia, edema, pneumothorax or
pleural effusion. Cardiopericardial silhouette is at upper limits of
normal for size. There is pulmonary vascular congestion without
overt pulmonary edema. The visualized bony structures of the thorax
show no acute abnormality.
IMPRESSION: No active cardiopulmonary disease.

## 2021-04-18 MED ORDER — POTASSIUM CHLORIDE 10 MEQ/100ML IV SOLN
10.0000 meq | INTRAVENOUS | Status: DC
  Administered 2021-04-18: 10 meq via INTRAVENOUS
  Filled 2021-04-18: qty 100

## 2021-04-18 MED ORDER — LACTATED RINGERS IV BOLUS
1000.0000 mL | Freq: Once | INTRAVENOUS | Status: AC
  Administered 2021-04-18: 1000 mL via INTRAVENOUS

## 2021-04-18 MED ORDER — ACETAMINOPHEN 10 MG/ML IV SOLN
1000.0000 mg | INTRAVENOUS | Status: AC
  Administered 2021-04-18: 1000 mg via INTRAVENOUS
  Filled 2021-04-18: qty 100

## 2021-04-18 MED ORDER — LACTATED RINGERS IV BOLUS
500.0000 mL | Freq: Once | INTRAVENOUS | Status: AC
  Administered 2021-04-18: 500 mL via INTRAVENOUS

## 2021-04-18 NOTE — ED Notes (Signed)
Pt verbalized understanding of discharge instructions and follow-up care instructions. Pt advised if symptoms worsen to come back to ED. E-signature was not available.

## 2021-04-18 NOTE — ED Provider Notes (Signed)
[facility] Emergency Department Provider Note  ____________________________________________   Event Date/Time   First MD Initiated Contact with Patient 04/18/21 [phone]     (approximate)  I have reviewed the triage vital signs and the nursing notes.   HISTORY  Chief Complaint Chest Pain   HPI Leslie Powell is a 25 y.o. female [id number] approximately [redacted] weeks along in the pregnancy my last patient presents for assessment of some sudden onset of chest pain associate with shortness of breath, back pain, nausea and some epigastric discomfort.  Patient denies any injuries or falls, fevers, cough, vomiting, diarrhea, lower abdominal pain, vaginal bleeding or discharge, rash or extremity pain.  He states he has had 1 COVID appointment just to get lab work but has not yet seen OB/GYN for this pregnancy.  He denies EtOH use, tobacco abuse or illicit drug use.  Denies any other significant past medical history.  No other acute concerns.         History reviewed. No pertinent past medical history.  There are no problems to display for this patient.   History reviewed. No pertinent surgical history.  Prior to Admission medications   Not on File    Allergies Patient has no allergy information on record.  No family history on file.  Social History    Review of Systems  Review of Systems  Constitutional:  Positive for fever (subjective). Negative for chills.  HENT:  Negative for sore throat.   Eyes:  Negative for pain.  Respiratory:  Positive for shortness of breath. Negative for cough and stridor.   Cardiovascular:  Positive for chest pain.  Gastrointestinal:  Positive for abdominal pain and nausea. Negative for vomiting.  Genitourinary:  Negative for dysuria.  Musculoskeletal:  Positive for back pain.  Skin:  Negative for rash.  Neurological:  Negative for seizures, loss of consciousness and headaches.  Psychiatric/Behavioral:  Negative for  suicidal ideas.   All other systems reviewed and are negative.    ____________________________________________   PHYSICAL EXAM:  VITAL SIGNS: ED Triage Vitals  Enc Vitals Group     BP 04/18/21 0745 101/60     Pulse Rate 04/18/21 0745 68     Resp 04/18/21 0745 20     Temp 04/18/21 0745 98.3 F (36.8 C)     Temp Source 04/18/21 0745 Oral     SpO2 04/18/21 0745 100 %     Weight 04/18/21 0740 187 lb (84.8 kg)     Height 04/18/21 0740 5' (1.524 m)     Head Circumference --      Peak Flow --      Pain Score 04/18/21 0740 10     Pain Loc --      Pain Edu? --      Excl. in GC? --    Vitals:   04/18/21 0859 04/18/21 1101  BP: 119/76 112/70  Pulse: 86 70  Resp: 20 15  Temp:    SpO2: 98% 100%   Physical Exam Vitals and nursing note reviewed.  Constitutional:      General: She is not in acute distress.    Appearance: She is well-developed. She is ill-appearing.  HENT:     Head: Normocephalic and atraumatic.     Right Ear: External ear normal.     Left Ear: External ear normal.     Nose: Nose normal.  Eyes:     Conjunctiva/sclera: Conjunctivae normal.  Cardiovascular:     Rate and Rhythm: Normal  rate and regular rhythm.     Heart sounds: No murmur heard. Pulmonary:     Effort: Pulmonary effort is normal. No respiratory distress.     Breath sounds: Normal breath sounds.  Abdominal:     Palpations: Abdomen is soft.     Tenderness: There is abdominal tenderness. There is right CVA tenderness and left CVA tenderness.  Musculoskeletal:        General: No swelling.     Cervical back: Neck supple.  Skin:    General: Skin is warm and dry.     Capillary Refill: Capillary refill takes less than 2 seconds.  Neurological:     Mental Status: She is alert.  Psychiatric:        Mood and Affect: Mood normal.    Mild tenderness in the epigastrium CVA regions. ____________________________________________   LABS (all labs ordered are listed, but only abnormal results are  displayed)  Labs Reviewed  BASIC METABOLIC PANEL - Abnormal; Notable for the following components:      Result Value   Sodium 134 (*)    Potassium 3.4 (*)    CO2 20 (*)    Glucose, Bld 116 (*)    Creatinine, Ser 0.42 (*)    All other components within normal limits  CBC - Abnormal; Notable for the following components:   WBC 17.5 (*)    All other components within normal limits  URINALYSIS, COMPLETE (UACMP) WITH MICROSCOPIC - Abnormal; Notable for the following components:   Color, Urine YELLOW (*)    APPearance HAZY (*)    Specific Gravity, Urine 1.004 (*)    pH 9.0 (*)    All other components within normal limits  RESP PANEL BY RT-PCR (FLU A&B, COVID) ARPGX2  D-DIMER, QUANTITATIVE (NOT AT [facility])  HEPATIC FUNCTION PANEL  LIPASE, BLOOD  PROCALCITONIN  HCG, QUANTITATIVE, PREGNANCY  POC URINE PREG, ED  TROPONIN I (HIGH SENSITIVITY)   ____________________________________________  EKG  Normal sinus rhythm with a ventricular rate of 73, normal axis, normal intervals with no acute ischemia or significant arrhythmia. ____________________________________________  RADIOLOGY  ED MD interpretation:    Chest x-ray shows no pneumothorax, effusion, focal consolidation, edema or other acute thoracic process.  Right upper quadrant ultrasound shows some small amount of sludge in the gallbladder without evidence of cholecystitis or cholelithiasis.  There is no bile duct dilation and portal vein is patent on Doppler imaging.  Pelvic OB ultrasound shows single IUP at 12 weeks 3 days by size without any visualized subchorionic hemorrhage.  There is no free fluid ovaries are unremarkable.  Fetal heart rate is 160.  Official radiology report(s): DG Chest 2 View  Result Date: 04/18/2021 CLINICAL DATA:  Chest pain and shortness of breath. EXAM: CHEST - 2 VIEW COMPARISON:  No comparison studies available. FINDINGS: The lungs are clear without focal pneumonia, edema, pneumothorax or pleural  effusion. Cardiopericardial silhouette is at upper limits of normal for size. There is pulmonary vascular congestion without overt pulmonary edema. The visualized bony structures of the thorax show no acute abnormality. IMPRESSION: No active cardiopulmonary disease. Electronically Signed   By: Kennith Center M.D.   On: 04/18/2021 08:26   US OB Comp Less 14 Wks  Result Date: 04/18/2021 CLINICAL DATA:  . lower abdominal pain with known intrauterine gestation. EXAM: OBSTETRIC <14 WK [place] AND TRANSVAGINAL OB US TECHNIQUE: Both transabdominal and transvaginal ultrasound examinations were performed for complete evaluation of the gestation as well as the maternal uterus, adnexal regions, and pelvic cul-de-sac.  Transvaginal technique was performed to assess early pregnancy. COMPARISON:  03/09/2021 FINDINGS: Intrauterine gestational sac: Single Yolk sac:  Not visualized Embryo:  Visualized Cardiac Activity: Visualized Heart Rate: 160 bpm CRL:  59.1 mm   12 w   3 d                  [place] EDC: 10/28/2021 Subchorionic hemorrhage:  None visualized. Maternal uterus/adnexae: Maternal ovaries unremarkable. No intraperitoneal free fluid. IMPRESSION: Single living intrauterine gestation at estimated 12 week 3 day gestational age by crown-rump length. Electronically Signed   By: Kennith Center M.D.   On: 04/18/2021 11:12   US ABDOMEN LIMITED RUQ (LIVER/GB)  Result Date: 04/18/2021 CLINICAL DATA:  Chest pain, nausea, shortness of breath. EXAM: ULTRASOUND ABDOMEN LIMITED RIGHT UPPER QUADRANT COMPARISON:  None. FINDINGS: Gallbladder: Small amount of sludge within the gallbladder. No gallstones seen. No gallbladder wall thickening or pericholecystic fluid. No sonographic Murphy's sign. Common bile duct: Diameter: 5 mm Liver: No focal lesion identified. Within normal limits in parenchymal echogenicity. Portal vein is patent on color Doppler imaging with normal direction of blood flow towards the liver. Other: None. IMPRESSION: 1. No acute  findings. No evidence of cholecystitis. No gallstones. No bile duct dilatation. 2. Small amount of sludge in the gallbladder. Electronically Signed   By: Bary Richard M.D.   On: 04/18/2021 11:05    ____________________________________________   PROCEDURES  Procedure(s) performed (including Critical Care):  .1-3 Lead EKG Interpretation Performed by: Gilles Chiquito, MD Authorized by: Gilles Chiquito, MD     Interpretation: normal     ECG rate assessment: normal     Rhythm: sinus rhythm     Ectopy: none     Conduction: normal     ____________________________________________   INITIAL IMPRESSION / ASSESSMENT AND PLAN / ED COURSE      Patient presents with above-stated history exam for evaluation of the above-noted constellation of symptoms.  On arrival patient is slight tachypneic with otherwise stable vital signs on room air.  Initial differential is fairly broad and includes pneumonia, ACS, myocarditis, PE, pyelonephritis, kidney stone, cystitis, cholecystitis, pancreatitis, acute viral syndrome and possible recurrence of subchorionic hemorrhage.  Patient already had an ultrasound performed was pregnancy on 1110/24 that did not show any evidence of ectopic and it does have a lower suspicion, at this time.  EKG and undetectable troponin are not suggestive of ACS or myocarditis or significant arrhythmia.  He was remarkable for WBC count of 17.5 without evidence of acute anemia and normal platelets.  BMP remarkable for potassium of 3.4 without other acute derangements.  COVID influenza PCR is negative.  Lipase not consistent pancreatitis.  Procalcitonin is undetectable.  Hepatic function panel is unremarkable.  D-dimer of 0.5 without other historical or exam features concerning for DVT or PE is not suggestive of PE at this time.   Chest x-ray shows no pneumothorax, effusion, focal consolidation, edema or other acute thoracic process.  UA is not evidence of cystitis.  Right  upper quadrant ultrasound shows some small amount of sludge in the gallbladder without evidence of cholecystitis or cholelithiasis.  There is no bile duct dilation and portal vein is patent on Doppler imaging.  Pelvic OB ultrasound shows single IUP at 12 weeks 3 days by size without any visualized subchorionic hemorrhage.  There is no free fluid ovaries are unremarkable.  Fetal heart rate is 160.  On reassessment after patient received some Tylenol she states she is feeling much better with resolution  of her chest pain concerns of breath.  She was able to ambulate around the emergency room without difficulty and tolerate p.o.  Unclear etiology of patient's symptoms earlier today although given treatment Tylenol otherwise reassuring exam work-up and low suspicion for sepsis or other immediate life-threatening pathology states she is stable for discharge with outpatient follow-up.  Discharged in stable condition.  Strict return cautions advised and discussed.   ____________________________________________   FINAL CLINICAL IMPRESSION(S) / ED DIAGNOSES  Final diagnoses:  Abdominal pain  Chest pain, unspecified type  Other acute back pain  Hypokalemia  SOB (shortness of breath)    Medications  potassium chloride 10 mEq in 100 mL IVPB (10 mEq Intravenous New Bag/Given 04/18/21 1100)  lactated ringers bolus 1,000 mL (0 mLs Intravenous Stopped 04/18/21 0943)  acetaminophen (OFIRMEV) IV 1,000 mg (0 mg Intravenous Stopped 04/18/21 1058)  lactated ringers bolus 500 mL (500 mLs Intravenous New Bag/Given 04/18/21 1113)     ED Discharge Orders     None        Note:  This document was prepared using Dragon voice recognition software and may include unintentional dictation errors.    Gilles Chiquito, MD 04/18/21 [phone]

## 2021-04-18 NOTE — ED Triage Notes (Signed)
Pt to ED For bilateral flank pain, chest pain, shob, nausea that started this am. [redacted] weeks pregnant, 4th pregnancy. Denies urinary sx  Pt appears uncomfortable in triage.   Discussed pt with Dr Lenard Lance

## 2021-04-20 LAB — POC URINE PREG, ED: Preg Test, Ur: POSITIVE — AB

## 2021-04-28 ENCOUNTER — Encounter: Payer: Self-pay | Admitting: Family Medicine

## 2021-04-28 ENCOUNTER — Ambulatory Visit: Payer: Medicaid Other | Admitting: Advanced Practice Midwife

## 2021-04-28 ENCOUNTER — Encounter: Payer: Self-pay | Admitting: Advanced Practice Midwife

## 2021-04-28 ENCOUNTER — Other Ambulatory Visit: Payer: Self-pay

## 2021-04-28 DIAGNOSIS — O99210 Obesity complicating pregnancy, unspecified trimester: Secondary | ICD-10-CM

## 2021-04-28 DIAGNOSIS — Z3401 Encounter for supervision of normal first pregnancy, first trimester: Secondary | ICD-10-CM

## 2021-04-28 DIAGNOSIS — O99211 Obesity complicating pregnancy, first trimester: Secondary | ICD-10-CM

## 2021-04-28 LAB — URINALYSIS
Bilirubin, UA: NEGATIVE
Glucose, UA: NEGATIVE
Ketones, UA: NEGATIVE
Leukocytes,UA: NEGATIVE
Nitrite, UA: NEGATIVE
Protein,UA: NEGATIVE
RBC, UA: NEGATIVE
Specific Gravity, UA: 1.025 (ref 1.005–1.030)
Urobilinogen, Ur: 0.2 mg/dL (ref 0.2–1.0)
pH, UA: 7 (ref 5.0–7.5)

## 2021-04-28 LAB — WET PREP FOR TRICH, YEAST, CLUE: Trichomonas Exam: NEGATIVE

## 2021-04-28 LAB — HEMOGLOBIN, FINGERSTICK: Hemoglobin: 13 g/dL (ref 11.1–15.9)

## 2021-04-28 NOTE — Progress Notes (Signed)
[facility]  Maternal Health Clinic   INITIAL PRENATAL VISIT NOTE  Subjective:  Leslie Powell is a 25 y.o. MHF nonsmoker G2P0010 at [redacted]w[redacted]d being seen today to start prenatal care at the [facility] Department.  She feels "happy" about surprise pregnancy with no birth control. 27 yo unemployed husband of 5 years feels "happy" about pregnancy and is supportive. LMP 01/25/21. Last pap 07/29/15 neg. Has been to ER x2 this pregnancy with 2 u/s (03/09/21 for cramping and spotting @ 5 6/7 with no embryo seen; 04/18/21 because of ribs hurting and difficulty breathing @ 12 3/7. Has never been to a dentist. Denies cigs, vaping, cigars. Last MJ 2021. Last ETOH 02/2021 (1 shot Tequila) "rarely".  Wants Quad screen. Living with husband, her mom, 1 brother, 1 sister. Working 30 hrs/wk at [employer]. [employer]. She is currently monitored for the following issues for this low-risk pregnancy and has Obesity affecting pregnancy and Encounter for supervision of normal first pregnancy in first trimester on their problem list.  Patient reports no complaints.   .  .   . Denies leaking of fluid.   Indications for ASA therapy (per uptodate) One of the following: Previous pregnancy with preeclampsia, especially early onset and with an adverse outcome No Multifetal gestation No Chronic hypertension No Type 1 or 2 diabetes mellitus No Chronic kidney disease No Autoimmune disease (antiphospholipid syndrome, systemic lupus erythematosus) No  Two or more of the following: Nulliparity No Obesity (body mass index >30 kg/m2) Yes Family history of preeclampsia in mother or sister No Age ?35 years No Sociodemographic characteristics (African American race, low socioeconomic level) No Personal risk factors (eg, previous pregnancy with low birth weight or small for gestational age infant, previous adverse pregnancy outcome [eg, stillbirth], interval >10 years between pregnancies) No   The  following portions of the patient's history were reviewed and updated as appropriate: allergies, current medications, past family history, past medical history, past social history, past surgical history and problem list. Problem list updated.  Objective:   Vitals:   04/28/21 0835  BP: (!) 109/7  Pulse: 80  Temp: 98.7 F (37.1 C)  Weight: 179 lb (81.2 kg)    Fetal Status:            Physical Exam Vitals and nursing note reviewed.  Constitutional:      General: She is not in acute distress.    Appearance: Normal appearance. She is well-developed. She is obese.  HENT:     Head: Normocephalic and atraumatic.     Right Ear: External ear normal.     Left Ear: External ear normal.     Nose: Nose normal. No congestion or rhinorrhea.     Mouth/Throat:     Lips: Pink.     Mouth: Mucous membranes are moist.     Dentition: Normal dentition. No dental caries.     Pharynx: Oropharynx is clear. Uvula midline.     Comments: Dentition: fair; never been to dentist; please give dental list and needs exam asap Eyes:     General: No scleral icterus.    Conjunctiva/sclera: Conjunctivae normal.  Neck:     Thyroid: No thyroid mass, thyromegaly or thyroid tenderness.  Cardiovascular:     Rate and Rhythm: Normal rate.     Pulses: Normal pulses.     Comments: Extremities are warm and well perfused Pulmonary:     Effort: Pulmonary effort is normal.     Breath sounds: Normal  breath sounds.  Chest:     Chest wall: No mass.  Breasts:    Tanner Score is 5.     Breasts are symmetrical.     Right: Normal. No mass, nipple discharge or skin change.     Left: Normal. No mass, nipple discharge or skin change.  Abdominal:     Palpations: Abdomen is soft.     Tenderness: There is no abdominal tenderness.     Comments: Gravid, soft without masses or tenderness, FHR=160, FH 12 wks, poor tone, increased adipose  Genitourinary:    General: Normal vulva.     Exam position: Lithotomy position.     Pubic  Area: No rash.      Labia:        Right: No rash.        Left: No rash.      Vagina: Vaginal discharge (white creamy leukorrhea, ph<4.5) present.     Cervix: Friability (friable to pap) present. No cervical motion tenderness.     Uterus: Normal. Enlarged (Gravid 12 wks size but difficult to assess due to increased adipose). Not tender.      Rectum: Normal. No external hemorrhoid.     Comments: Pap done Musculoskeletal:     Right lower leg: No edema.     Left lower leg: No edema.  Lymphadenopathy:     Cervical: No cervical adenopathy.     Upper Body:     Right upper body: No axillary adenopathy.     Left upper body: No axillary adenopathy.  Skin:    General: Skin is warm.     Capillary Refill: Capillary refill takes less than 2 seconds.  Neurological:     Mental Status: She is alert.    Assessment and Plan:  Pregnancy: G2P0010 at [redacted]w[redacted]d  1. Obesity affecting pregnancy, antepartum Counseled on weight gain of 11-20 lbs this pregnancy Pt accepts ASA 81 mg daily to begin today  2. Encounter for supervision of normal first pregnancy in first trimester Pt wants Quad screen Has had 2 u/s thus far this pregnancy (03/09/21 and 04/18/21) Will need anatomy u/s--ordered 1 hour glucola today - TSH - Urine Culture - Chlamydia/GC NAA, Confirmation - Glucose, 1 hour gestational - Hgb Fractionation Cascade - HIV-1/HIV-2 Qualitative RNA - HCV Ab w Reflex to Quant PCR - Comprehensive metabolic panel - Prenatal profile without Varicella or Rubella - Protein / creatinine ratio, urine - QuantiFERON-TB Gold Plus - Hgb A1c w/o eAG - IGP, rfx Aptima HPV ASCU - 016553 Drug Screen - WET PREP FOR TRICH, YEAST, CLUE - Urinalysis (Urine Dip) - Hemoglobin, venipuncture    Discussed overview of care and coordination with inpatient delivery practices including [facility], [facility], [facility] and [facility].   Reviewed Centering pregnancy as standard of care at [facility]   Preterm labor  symptoms and general obstetric precautions including but not limited to vaginal bleeding, contractions, leaking of fluid and fetal movement were reviewed in detail with the patient.  Please refer to After Visit Summary for other counseling recommendations.   Return in about 4 weeks (around 05/26/2021) for routine PNC.  No future appointments.  Alberteen Spindle, CNM

## 2021-04-28 NOTE — Progress Notes (Addendum)
(  Maiden name is DelRioDeLuna - name in Designer, multimedia). Presents for initiation of prenatal care. [facility] ED evaluation 02/2021 for vaginal spotting / cramping and [place] completed. [facility] ED evaluation in 04/2021 for bilateral rib pain / back pain / chest pain with SOB. Per client, no etiology found and above resolved spontaneously after ~ 5 hours. States potassium was low and received a bag of fluids with potassium. Born in [place] with arrival to [place] in 2000 - no international travel since. Client unsure if had PAP smear at [facility] in 2020. [facility] faxed for all PAP reports with fax confirmation received. Counseled regardign recommendation for flu vaccine - declined today. Jossie Ng, RN In house labs reviewed and no interventions required per standing order. Client counseled that RN would notify her of [place] appt via phone call. Jossie Ng, RN

## 2021-04-29 ENCOUNTER — Telehealth: Payer: Self-pay

## 2021-04-29 ENCOUNTER — Other Ambulatory Visit: Payer: Self-pay | Admitting: Advanced Practice Midwife

## 2021-04-29 DIAGNOSIS — Z3401 Encounter for supervision of normal first pregnancy, first trimester: Secondary | ICD-10-CM

## 2021-04-29 LAB — CBC/D/PLT+RPR+RH+ABO+AB SCR
Antibody Screen: NEGATIVE
Basophils Absolute: 0 10*3/uL (ref 0.0–0.2)
Basos: 0 %
EOS (ABSOLUTE): 0.1 10*3/uL (ref 0.0–0.4)
Eos: 1 %
Hematocrit: 38.5 % (ref 34.0–46.6)
Hemoglobin: 12.9 g/dL (ref 11.1–15.9)
Hepatitis B Surface Ag: NEGATIVE
Immature Grans (Abs): 0.1 10*3/uL (ref 0.0–0.1)
Immature Granulocytes: 1 %
Lymphocytes Absolute: 2.1 10*3/uL (ref 0.7–3.1)
Lymphs: 16 %
MCH: 29.3 pg (ref 26.6–33.0)
MCHC: 33.5 g/dL (ref 31.5–35.7)
MCV: 87 fL (ref 79–97)
Monocytes Absolute: 0.4 10*3/uL (ref 0.1–0.9)
Monocytes: 3 %
Neutrophils Absolute: 10.3 10*3/uL — ABNORMAL HIGH (ref 1.4–7.0)
Neutrophils: 79 %
Platelets: 350 10*3/uL (ref 150–450)
RBC: 4.41 x10E6/uL (ref 3.77–5.28)
RDW: 13.5 % (ref 11.7–15.4)
RPR Ser Ql: NONREACTIVE
Rh Factor: POSITIVE
WBC: 13 10*3/uL — ABNORMAL HIGH (ref 3.4–10.8)

## 2021-04-29 LAB — COMPREHENSIVE METABOLIC PANEL
ALT: 16 IU/L (ref 0–32)
AST: 13 IU/L (ref 0–40)
Albumin/Globulin Ratio: 1.2 (ref 1.2–2.2)
Albumin: 3.6 g/dL — ABNORMAL LOW (ref 3.9–5.0)
Alkaline Phosphatase: 51 IU/L (ref 44–121)
BUN/Creatinine Ratio: 12 (ref 9–23)
BUN: 5 mg/dL — ABNORMAL LOW (ref 6–20)
Bilirubin Total: 0.2 mg/dL (ref 0.0–1.2)
CO2: 21 mmol/L (ref 20–29)
Calcium: 9.1 mg/dL (ref 8.7–10.2)
Chloride: 101 mmol/L (ref 96–106)
Creatinine, Ser: 0.43 mg/dL — ABNORMAL LOW (ref 0.57–1.00)
Globulin, Total: 2.9 g/dL (ref 1.5–4.5)
Glucose: 115 mg/dL — ABNORMAL HIGH (ref 70–99)
Potassium: 3.9 mmol/L (ref 3.5–5.2)
Sodium: 135 mmol/L (ref 134–144)
Total Protein: 6.5 g/dL (ref 6.0–8.5)
eGFR: 137 mL/min/{1.73_m2} (ref >59)

## 2021-04-29 LAB — GLUCOSE, 1 HOUR GESTATIONAL: Gestational Diabetes Screen: 113 mg/dL (ref 70–139)

## 2021-04-29 LAB — IGP, RFX APTIMA HPV ASCU: PAP Smear Comment: 0

## 2021-04-29 LAB — HGB A1C W/O EAG: Hgb A1c MFr Bld: 5.2 % (ref 4.8–5.6)

## 2021-04-29 LAB — HCV INTERPRETATION

## 2021-04-29 LAB — HCV AB W REFLEX TO QUANT PCR: HCV Ab: <0.1 s/co ratio (ref 0.0–0.9)

## 2021-04-29 LAB — TSH: TSH: 0.025 u[IU]/mL — ABNORMAL LOW (ref 0.450–4.500)

## 2021-04-29 NOTE — Telephone Encounter (Signed)
Call to client with [facility] anatomy US appt 06/03/2021 at 1430 (arrive 1400). Verbal directions to [facility] provided. Jossie Ng, RN

## 2021-04-30 LAB — 789231 7+OXYCODONE-BUND
Amphetamines, Urine: NEGATIVE ng/mL
BENZODIAZ UR QL: NEGATIVE ng/mL
Barbiturate screen, urine: NEGATIVE ng/mL
Cannabinoid Quant, Ur: NEGATIVE ng/mL
Cocaine (Metab.): NEGATIVE ng/mL
OPIATE SCREEN URINE: NEGATIVE ng/mL
Oxycodone/Oxymorphone, Urine: NEGATIVE ng/mL
PCP Quant, Ur: NEGATIVE ng/mL

## 2021-04-30 LAB — PROTEIN / CREATININE RATIO, URINE
Creatinine, Urine: 166.1 mg/dL
Protein, Ur: 14.2 mg/dL
Protein/Creat Ratio: 85 mg/g creat (ref 0–200)

## 2021-05-01 ENCOUNTER — Other Ambulatory Visit: Payer: Self-pay | Admitting: Advanced Practice Midwife

## 2021-05-01 ENCOUNTER — Encounter: Payer: Self-pay | Admitting: Advanced Practice Midwife

## 2021-05-01 DIAGNOSIS — R799 Abnormal finding of blood chemistry, unspecified: Secondary | ICD-10-CM

## 2021-05-01 LAB — SPECIMEN STATUS REPORT

## 2021-05-01 LAB — T4: T4, Total: 11.3 ug/dL (ref 4.5–12.0)

## 2021-05-01 LAB — T3: T3, Total: 212 ng/dL — ABNORMAL HIGH (ref 71–180)

## 2021-05-02 LAB — URINE CULTURE

## 2021-05-02 LAB — CHLAMYDIA/GC NAA, CONFIRMATION
Chlamydia trachomatis, NAA: POSITIVE — AB
Neisseria gonorrhoeae, NAA: NEGATIVE

## 2021-05-02 LAB — C. TRACHOMATIS NAA, CONFIRM: C. trachomatis NAA, Confirm: POSITIVE — AB

## 2021-05-03 LAB — HGB FRACTIONATION CASCADE
Hgb A2: 2.5 % (ref 1.8–3.2)
Hgb A: 97.5 % (ref 96.4–98.8)
Hgb F: 0 % (ref 0.0–2.0)
Hgb S: 0 %

## 2021-05-03 LAB — QUANTIFERON-TB GOLD PLUS
QuantiFERON Mitogen Value: >10 IU/mL
QuantiFERON Nil Value: 0.02 IU/mL
QuantiFERON TB1 Ag Value: 0.01 IU/mL
QuantiFERON TB2 Ag Value: 0.01 IU/mL
QuantiFERON-TB Gold Plus: NEGATIVE

## 2021-05-03 LAB — HIV-1/HIV-2 QUALITATIVE RNA
HIV-1 RNA, Qualitative: NONREACTIVE
HIV-2 RNA, Qualitative: NONREACTIVE

## 2021-05-04 ENCOUNTER — Ambulatory Visit: Payer: Medicaid Other | Admitting: Advanced Practice Midwife

## 2021-05-04 ENCOUNTER — Telehealth: Payer: Self-pay

## 2021-05-04 ENCOUNTER — Other Ambulatory Visit: Payer: Self-pay

## 2021-05-04 DIAGNOSIS — A568 Sexually transmitted chlamydial infection of other sites: Secondary | ICD-10-CM | POA: Insufficient documentation

## 2021-05-04 DIAGNOSIS — O98312 Other infections with a predominantly sexual mode of transmission complicating pregnancy, second trimester: Secondary | ICD-10-CM

## 2021-05-04 NOTE — Telephone Encounter (Signed)
Call to client and counseled regarding need for chlamydia treatment. Appt scheduled for today with arrival time of 1245. Jossie Ng, RN

## 2021-05-04 NOTE — Progress Notes (Signed)
Client aware of [facility] MFM consult appt 05/14/2021, [facility] RV appt 05/26/2021 and 06/03/2021 [facility] OB US appt. Jossie Ng, RN

## 2021-05-04 NOTE — Telephone Encounter (Signed)
Call to client to notify her of [facility]) consult appt to r/o hyperthyroidism on 05/14/2021 at 1100 (arrive 1045). Verbal directions to facility provided with stated understanding. Jossie Ng, RN

## 2021-05-05 ENCOUNTER — Telehealth: Payer: Self-pay

## 2021-05-05 MED ORDER — AZITHROMYCIN 500 MG PO TABS
1000.0000 mg | ORAL_TABLET | Freq: Once | ORAL | Status: AC
Start: 2021-05-05 — End: 2021-05-05
  Administered 2021-05-05: 16:00:00 1000 mg via ORAL

## 2021-05-05 NOTE — Addendum Note (Signed)
Addended by: Veatrice Kells on: 05/05/2021 03:35 PM   Modules accepted: Orders

## 2021-05-05 NOTE — Telephone Encounter (Signed)
Received call from [place] Cd nurse Pam. Inquiring about pt-Pt tested + Chlamydia and wanted to f/u if received tx . Pt was treated 12/19- Azithromycin 1gm. Pt is pregnant.

## 2021-05-05 NOTE — Telephone Encounter (Deleted)
Received call from [place] CD nurse Leafy Half inquiring about this pt who tested + for GC-were they treated & medication given?Marland KitchenAdvised caller that pt received sti tx per standing orders Cefitriaxone  IM X1 and Doxycycline BID x 7days.

## 2021-05-14 ENCOUNTER — Ambulatory Visit: Payer: Self-pay

## 2021-05-14 ENCOUNTER — Other Ambulatory Visit
Admission: RE | Admit: 2021-05-14 | Discharge: 2021-05-14 | Disposition: A | Discharge status: Home / Self Care | Payer: Self-pay | Source: Ambulatory Visit | Attending: Maternal & Fetal Medicine | Admitting: Maternal & Fetal Medicine

## 2021-05-14 ENCOUNTER — Other Ambulatory Visit: Payer: Self-pay

## 2021-05-14 ENCOUNTER — Ambulatory Visit: Payer: Self-pay | Attending: Maternal & Fetal Medicine | Admitting: Maternal & Fetal Medicine

## 2021-05-14 VITALS — BP 138/83 | HR 97 | Temp 98.0°F | Ht 60.0 in | Wt 180.0 lb

## 2021-05-14 DIAGNOSIS — Z36 Encounter for antenatal screening for chromosomal anomalies: Secondary | ICD-10-CM

## 2021-05-14 DIAGNOSIS — O99282 Endocrine, nutritional and metabolic diseases complicating pregnancy, second trimester: Secondary | ICD-10-CM

## 2021-05-14 DIAGNOSIS — E0590 Thyrotoxicosis, unspecified without thyrotoxic crisis or storm: Secondary | ICD-10-CM

## 2021-05-14 DIAGNOSIS — R799 Abnormal finding of blood chemistry, unspecified: Secondary | ICD-10-CM

## 2021-05-14 DIAGNOSIS — Z3A16 16 weeks gestation of pregnancy: Secondary | ICD-10-CM

## 2021-05-14 NOTE — Progress Notes (Signed)
Referring physician:  Donnal Moat, [facility] Department Length of Consultation: 15 minutes  Leslie Powell  was seen at [facility] at [facility] for and MFM consultation and was provided brief genetic counseling to review prenatal screening and testing options.  This note summarizes the information we discussed.    We offered the following routine screening tests for this pregnancy:  Cell free fetal DNA testing from maternal blood may be used to determine whether a baby is at high risk for Down syndrome, trisomy 92, or trisomy 74.  This test utilizes a maternal blood sample and DNA sequencing technology to isolate circulating cell free fetal DNA from maternal plasma.  The fetal DNA can then be analyzed for DNA sequences that are derived from the three most common chromosomes involved in aneuploidy, chromosomes 13, 18, and 21.  If the overall amount of DNA is greater than the expected level for any of these chromosomes, aneuploidy is suspected.  The detection rate for Down syndrome and trisomy 18 is >99% and the detection rate for trisomy 13 is >91%. While we do not consider it a replacement for invasive testing and karyotype analysis, a negative result from this testing would be reassuring, though not a guarantee of a normal chromosome complement for the baby.  An abnormal result is certainly suggestive of an abnormal chromosome complement, though we would still recommend CVS or amniocentesis to confirm any findings from this testing. This testing can also assess for the sex chromosomes and can detect approximately 96% of sex chromosome aneuploidies and determine fetal gender with >99% confidence.    Maternal serum marker screening, a blood test that measures pregnancy proteins, can provide risk assessments for Down syndrome, trisomy 18, and open neural tube defects (spina bifida, anencephaly). Because it does not directly examine the fetus, it cannot positively diagnose or rule  out these problems. Because this screening has a lower detection rate for chromosome conditions, if a patient elected cell free fetal DNA testing, then an AFP only is recommended.  Targeted ultrasound uses high frequency sound waves to create an image of the developing fetus.  An ultrasound is often recommended as a routine means of evaluating the pregnancy.  It is also used to screen for fetal anatomy problems (for example, a heart defect) that might be suggestive of a chromosomal or other abnormality.   Should these screening tests indicate an increased concern, then the following additional testing options would be offered:  Amniocentesis involves the removal of a small amount of amniotic fluid from the sac surrounding the fetus with the use of a thin needle inserted through the maternal abdomen and uterus.  Ultrasound guidance is used throughout the procedure.  Fetal cells from amniotic fluid are directly evaluated and > 99.5% of chromosome problems and > 98% of open neural tube defects can be detected. This procedure is generally performed after the 15th week of pregnancy.  The main risks to this procedure include complications leading to miscarriage in less than 1 in 200 cases (0.5%).  Cystic Fibrosis and Spinal Muscular Atrophy (SMA) screening were also discussed with the patient. Both conditions are recessive, which means that both parents must be carriers in order to have a child with the disease.  Cystic fibrosis (CF) is one of the most common genetic conditions in persons of Caucasian ancestry.  This condition occurs in approximately 1 in 2,500 Caucasian persons and results in thickened secretions in the lungs, digestive, and reproductive systems.  For a  baby to be at risk for having CF, both of the parents must be carriers for this condition.  Approximately 1 in 37 Caucasian persons is a carrier for CF.  Current carrier testing looks for the most common mutations in the gene for CF and can detect  approximately 90% of carriers in the Caucasian population.  This means that the carrier screening can greatly reduce, but cannot eliminate, the chance for an individual to have a child with CF.  If an individual is found to be a carrier for CF, then carrier testing would be available for the partner. As part of [place] newborn screening profile, all babies born in the state of [place] will have a two-tier screening process.  Specimens are first tested to determine the concentration of immunoreactive trypsinogen (IRT).  The top 5% of specimens with the highest IRT values then undergo DNA testing using a panel of over 40 common CF mutations. SMA is a neurodegenerative disorder that leads to atrophy of skeletal muscle and overall weakness.  This condition is also more prevalent in the Caucasian population, with 1 in 40-1 in 60 persons being a carrier and 1 in 6,000-1 in 10,000 children being affected.  There are multiple forms of the disease, with some causing death in infancy to other forms with survival into adulthood.  The genetics of SMA is complex, but carrier screening can detect up to 95% of carriers in the Caucasian population.  Similar to CF, a negative result can greatly reduce, but cannot eliminate, the chance to have a child with SMA. Hemoglobinopathy screening was also made available.  We inquired about the family history and pregnancy history.  The couple both stated there were no persons with birth defects, intellectual delays, recurrent pregnancy loss or known genetic conditions. The patient met with Dr. Gertie Exon regarding the current pregnancy and possible hyperthyroidism.  Ms. Makela was encouraged to call with questions or concerns.  We can be contacted at [phone].  Plan of care: MaterniT21 PLUS with SCA and msAFP drawn today. Results will be called to the patient when they become available, usually in 1 week. Patient declined carrier screening for CF, SMA and  hemoglobinopathies. Anatomy ultrasound scheduled for 06/03/21.  Wilburt Finlay, MS, CGC

## 2021-05-14 NOTE — Progress Notes (Signed)
MFM Consultation  Ms. Buresh is a 25 yo G2P0 who is here at 24 w 1d with an EDD of 10/28/21-dated bu 12 weel 3 d ultrasound. She is seen at the request of Arnetha Courser, CNM regarding concern for overt hyperthyroidism.  Ms. Graef is overall doing well. She denies s/sx of uterine contraction or bleeding.  Her pregnancy issues demonstrate an suppressed TSH measuring 0.025 and Total T3 of 212 and  total T4 of 11.3 .  She denies s/sx of hyperthyroidism such as tachycardia, weight loss,  hair loss, heat intolerance or anxiety. She denies history of autoimmune disorders.  Vitals with BMI 05/14/2021 04/28/2021 04/18/2021  Height 5\' 0"  - -  Weight 180 lbs 179 lbs -  BMI 35.15 34.96 -  Systolic 138 109  Diastolic 83 7 69  Pulse 97 80 70   OB History  Gravida Para Term Preterm AB Living  2 0 0 0 1 0  SAB IAB Ectopic Multiple Live Births  1 0 0 0 0    # Outcome Date GA Lbr Len/2nd Weight Sex Delivery Anes PTL Lv  2 Current           1 SAB 02/2019 [redacted]w[redacted]d            Birth Comments: "At home" SAB, no medical evaluation.   Past Medical History:  Diagnosis Date   Spontaneous abortion 02/2019   Past Surgical History:  Procedure Laterality Date   Denies surgical history           Current Outpatient Medications (Other):    Pediatric Multiple Vitamins (FLINTSTONES MULTIVITAMIN) CHEW, Chew 2 tablets by mouth daily at 6 (six) AM.   No Known Allergies   Impression/Counseling:  I discussed with Ms. Eberwein the diagnosis, evaluation and management of hyperthyroidism in pregnancy.  I discussed with Ms. Pilling that overt hyperthyroidism becomes a concern when the The TSH <0.1 or undetectable at 0.01. FT4 and FT3 should be < 1.5 x normal.   We discussed that in early pregnancy TFT's change suggesting hyperthyroidism.  Her TSH is 0.025 with a normal total T4 and slightly elevated total T3 but 1.5 x normal.   Ms. Severns is asymptomatic and given that her labs were drawn in early pregnancy I  suspect that her levels represent physiologic changes in pregnancy.  Therefore I recommend repeating her TFTs' 4-6 weeks from the time they were drawn around 20 weeks of gestation.  If TFT's are still abnormal consider order Thyroid receptor antibodies TraB's to assess risk for Graves disease and also consult endocrinology.  We discussed the complicatiosn of hyperthyroidism to included thyrotoxicosis. Thyrotoxicosis usually presents in the late first or early second trimester. Thyroid storm occurs in 1-2% of pregnancies complicated by hyperthyroidism. It manifests with fever, diaphoresis, cardiac abnormalities (tachycardia, arrhythmia, heart failure), CNS abnormalities (irritability, agitation, tremor, mental status change), GI (nausea, vomiting, diarrhea), and laboratory changes (leukocytosis, elevated LFT, elevated free T3/T4). Prompt Maternal Fetal Medicine and Endocrinology consultation should be obtained. Again Ms. Frede does not have any of the symptoms.   Complications associated with poorly controlled hyperthyroidism include hypertensive disorders in pregnancy, thyroid storm, heart failure, preterm labor, placental abruption, growth restriction, and neonatal death. Specifically, the two most serious maternal complications of untreated hyperthyroidism are heart failure and thyroid storm, a medical emergency associated with a maternal mortality rate of 25% even with appropriate management.   Heart failure is the more common of the two serious maternal complications. It is caused by the long-term myocardial  effects of T4 and is intensified by other pregnancy conditions such as preeclampsia, infection, or anemia.   There is a risk of fetal and/or neonatal thyrotoxicosis due to the passive transplacental passage of immunoglobulins associated with Graves disease. Such antibodies are able to exert an effect on the fetal thyroid at [redacted] weeks gestation. Infants born to mothers with high titers of  antibodies or poorly controlled [phone]  are particularly at risk.   Fetal thyrotoxicosis may result in preterm delivery and the development of fetal craniosynostosis, exophthalmos, heart failure (hydrops fetalis), hepatosplenomegaly, thrombocytopenia, exophthalmos, goiter (with neck obstruction and polyhydramnios), and growth restriction. Additional neonatal features include jaundice, poor feeding, poor weight gain, and irritability. The mortality of the condition may be as high as 25%.   The goal of therapy is to control the hyperthyroidism without causing fetal or neonatal hypothyroidism. Maternal free T4 should be maintained in the high-normal range or total T4 at 1.5 times the upper limit of normal (nonpregnant) range. Until recently, propylthiouracil (PTU) was considered the drug of choice in young women who may become pregnant and in pregnant patients with hyperthyroidism. This preference was based on reports of methimazole embryopathy that was not seen with PTU, although a later report linked PTU with cutis aplasia, a scalp deformity. Reports describing higher hepatotoxicity profiles for PTU compared with methimazole, especially in children and adolescents, have raised concern regarding the use of PTU in the management of hyperthyroidism even in pregnant patients. These concerns had to be balanced by the known embryopathy associated with methimazole. The transplacental passage of the two drugs is similar. Methimazole may cause cutis aplasia, a scalp deformity. Although rare, there are reports of methimazole and carbimazole embryopathy with choanal atresia, tracheoesophageal fistula, and facial anomalies..T4 levels should initially be measured at 2-4 week intervals. PTU is given every 8 hours at doses of 100 to 150?mg (300 to 450?mg total daily dosage) according to thyrotoxicosis severity. Equivalent doses of PTU relative to methimazole are 10?:?1 or 15?:?1 (100?mg of PTU is equivalent to 7.5 to  10?mg of methimazole). Usual starting doses are up to 300?mg of PTU and up to 20?mg of methimazole. The occasional patient may require higher doses because the risk of uncontrolled hyperthyroidism is greater than that of high-dose PTU or methimazole. It can take 6 to 8 weeks for major clinical effects to manifest Lastly, a subtotal thyroidectomy can be performed after thyroid toxicosis is medically controlled, but is seldom done during pregnancy. Ablation with therapeutic radioactive iodine is contraindicated during pregnancy.  After the patient is euthyroid (reflected by monthly free T4 and free T3 values), the dose of antithyroid drugs should be tapered (e.g., halved), with further reduction as the pregnancy progresses. For many patients, antithyroid drugs can be discontinued by 32 to [redacted] weeks gestation, because remission of Graves disease during pregnancy is commonly observed, although relapse often occurs after delivery. Maternal side effects of PTU or methimazole treatment can include rash (?5%), pruritus, drug-related fever, hepatitis, a lupus-like syndrome, and bronchospasm. The alternative thionamide can be used, although cross-sensitivity occurs in 50% of patients. Agranulocytosis, which is the most serious side effect, develops in only 0.1%, occurring especially in older women and those receiving higher doses. All patients experiencing fever or unexpected sore throat on therapy should discontinue the drug and have white blood cell count monitoring. Agranulocytosis is a contraindication to further thionamide therapy; the blood count gradually improves over days or weeks.  PTU is not significantly concentrated in breast milk (10% of serum) and  does not appear to affect the infants thyroid hormone levels in any major way. Methimazole also does not appear to affect subsequent somatic or intellectual growth in children exposed to it during lactation. Antithyroid medication should be taken just after  breastfeeding, allowing a 3- to 4-hour interval before the woman lactates again. In patients who are in remission, the postpartum period of a subsequent pregnancy is significantly associated with relapse of Graves disease compared with those without a subsequent pregnancy.   In cases of thyroid storm, the following recommendations are encouraged:  initial treatment consists of laboratory evaluation, cooling blankets, telemetry and O2 therapy. Initial medical treatment is with PTU PO or by NG tube (300-600mg  then 150-300mg  q6hr). 1-2 hours after PTU is given, iodine should be given to inhibit T4 release. It is essential to delay iodine administration until after PTU is given. Options include oral potassium iodide (SSKI) 2-5 drops PO q8 hours, Lugol solution 8 drops q 6 hours, or IV sodium iodide 500-1000mg  every 8 hours. If these are unavailable oral gastrografin (Oragrafin, 62% iodide) 3g PO x 1 can be given and will suppress T4 release for 2-3 days. Glucocordicoids should also be initiated to prevent release and peripheral conversion of stored T4. Options include dexamethasone (2mg  IV/IM q 6 hours x 4 doses), hydrocortisone (300mg  IV daily) or prednisone 60mg  PO daily). Beta blockers and phenobarbital can be used as needed to control tachycardia and agitation. Further management and timing and route of delivery is individualized and best accomplished in a multidisciplinary fashion.   I spent 45 minutes with > 50% in face to face consultation.  All questions answered.  , MD.

## 2021-05-18 LAB — MATERNIT21 PLUS CORE+SCA
Fetal Fraction: 7
Monosomy X (Turner Syndrome): NOT DETECTED
Result (T21): NEGATIVE
Trisomy 13 (Patau syndrome): NEGATIVE
Trisomy 18 (Edwards syndrome): NEGATIVE
Trisomy 21 (Down syndrome): NEGATIVE
XXX (Triple X Syndrome): NOT DETECTED
XXY (Klinefelter Syndrome): NOT DETECTED
XYY (Jacobs Syndrome): NOT DETECTED

## 2021-05-19 ENCOUNTER — Telehealth: Payer: Self-pay

## 2021-05-19 NOTE — Telephone Encounter (Signed)
Early this am call transferred to clinic from [place] in the chartroom as client with c/o difficulty breathing. Client reported the following: onset of persistent sharp pain between breasts which she described as a feeling of ribs being squeezed. States pain is so severe it causes her not be able to "catch her breath" and pain goes through to her back. Pain lasted ~ 5 hours and partially relieved with extra strength Tylenol. During call, states pain much improved, but still feeling some pressure sensations between breasts. Reports similar event occurred last month and went to ED where problem source unable to be identified. Denies any nasal / face congestion, sx of respiratory illness, N/V, diarrhea (BM this am), belching, flatulence or any other problem. Per consult with E. Sciora CNM regarding above, client referred now to ED for evaluation. Client states hesitant to go as felt like ED trip last month was not productive. Counseled that ED had access to X-rays, [place], rapid return of lab results, IV fluids/meds/treatments, etc that not available at [facility] and thus referral to ED. Client in agreement with plan. Jossie Ng, RN

## 2021-05-26 ENCOUNTER — Ambulatory Visit: Payer: Medicaid Other | Admitting: Advanced Practice Midwife

## 2021-05-26 ENCOUNTER — Other Ambulatory Visit: Payer: Self-pay

## 2021-05-26 VITALS — BP 112/74 | HR 88 | Temp 97.2°F | Wt 181.0 lb

## 2021-05-26 DIAGNOSIS — O99212 Obesity complicating pregnancy, second trimester: Secondary | ICD-10-CM | POA: Diagnosis not present

## 2021-05-26 DIAGNOSIS — R799 Abnormal finding of blood chemistry, unspecified: Secondary | ICD-10-CM

## 2021-05-26 DIAGNOSIS — Z3402 Encounter for supervision of normal first pregnancy, second trimester: Secondary | ICD-10-CM

## 2021-05-26 DIAGNOSIS — O99210 Obesity complicating pregnancy, unspecified trimester: Secondary | ICD-10-CM

## 2021-05-26 DIAGNOSIS — Z3401 Encounter for supervision of normal first pregnancy, first trimester: Secondary | ICD-10-CM

## 2021-05-26 DIAGNOSIS — A568 Sexually transmitted chlamydial infection of other sites: Secondary | ICD-10-CM

## 2021-05-26 DIAGNOSIS — O98312 Other infections with a predominantly sexual mode of transmission complicating pregnancy, second trimester: Secondary | ICD-10-CM

## 2021-05-26 NOTE — Progress Notes (Signed)
[facility]  PRENATAL VISIT NOTE  Subjective:  Leslie Powell is a 26 y.o. G2P0010 at [redacted]w[redacted]d being seen today for ongoing prenatal care.  She is currently monitored for the following issues for this low-risk pregnancy and has Obesity affecting pregnancy; Encounter for supervision of normal first pregnancy in first trimester; Abnormal blood finding--r/o hyperthyroidism; and Chlamydia trachomatis infection in pregnancy 04/28/21 on their problem list.  Patient reports no complaints.  Contractions: Not present. Vag. Bleeding: None.  Movement: Present. Denies leaking of fluid/ROM.   The following portions of the patient's history were reviewed and updated as appropriate: allergies, current medications, past family history, past medical history, past social history, past surgical history and problem list. Problem list updated.  Objective:   Vitals:   05/26/21 0919  BP: 112/74  Pulse: 88  Temp: (!) 97.2 F (36.2 C)  Weight: 181 lb (82.1 kg)    Fetal Status: Fetal Heart Rate (bpm): 150 Fundal Height: 18 cm Movement: Present     General:  Alert, oriented and cooperative. Patient is in no acute distress.  Skin: Skin is warm and dry. No rash noted.   Cardiovascular: Normal heart rate noted  Respiratory: Normal respiratory effort, no problems with respiration noted  Abdomen: Soft, gravid, appropriate for gestational age.  Pain/Pressure: Absent     Pelvic: Cervical exam deferred        Extremities: Normal range of motion.  Edema: None  Mental Status: Normal mood and affect. Normal behavior. Normal judgment and thought content.   Assessment and Plan:  Pregnancy: G2P0010 at [redacted]w[redacted]d  1. Abnormal blood finding--r/o hyperthyroidism Had MFM consult on 05/14/21 to r/o hyperthyroidism (high TSH, high T3, T4 wnl)--probable physiologic changes in early pregnancy--recommend repeat TFT's in 4-6 wks at 20 wks; if still abnormal then consult  endocrinology.  To repeat TFT's next apt. Denies sxs hyperthyroidism AFP only today  2. Chlamydia trachomatis infection in mother during second trimester of pregnancy Treated  05/04/21. TOC done today (self collected). Will need test for reinfection 08/02/21 States partner was tested and treated soon after her tx  3. Obesity affecting pregnancy, antepartum 1 lb (0.454 kg) Has decided not to take ASA daily Not exercising--encouraged to do 3-4x/wk x 20 min  4. Encounter for supervision of normal first pregnancy in first trimester Has anatomy u/s 06/03/21 Working 30 hrs/wk. Living with husband, her mom, 2 siblings (13, 82) C/o GERD--suggestions given 1 hour glucola=113 on 04/28/21 - AFP, Serum, Open Spina Bifida - Chlamydia/GC NAA, Confirmation   Preterm labor symptoms and general obstetric precautions including but not limited to vaginal bleeding, contractions, leaking of fluid and fetal movement were reviewed in detail with the patient. Please refer to After Visit Summary for other counseling recommendations.  No follow-ups on file.  Future Appointments  Date Time Provider Department Center  06/03/2021  2:30 PM [facility]    Alberteen Spindle, CNM

## 2021-05-28 LAB — AFP, SERUM, OPEN SPINA BIFIDA
AFP MoM: 1.29
AFP Value: 49.4 ng/mL
Gest. Age on Collection Date: 17.6 weeks
Maternal Age At EDD: 27.9 yr
OSBR Risk 1 IN: 4947
Test Results:: NEGATIVE
Weight: 181 [lb_av]

## 2021-05-29 LAB — CHLAMYDIA/GC NAA, CONFIRMATION
Chlamydia trachomatis, NAA: NEGATIVE
Neisseria gonorrhoeae, NAA: NEGATIVE

## 2021-06-03 ENCOUNTER — Other Ambulatory Visit: Payer: Self-pay

## 2021-06-03 ENCOUNTER — Ambulatory Visit
Admission: RE | Admit: 2021-06-03 | Discharge: 2021-06-03 | Disposition: A | Discharge status: Home / Self Care | Payer: Self-pay | Source: Ambulatory Visit | Attending: Advanced Practice Midwife | Admitting: Advanced Practice Midwife

## 2021-06-03 DIAGNOSIS — Z3401 Encounter for supervision of normal first pregnancy, first trimester: Secondary | ICD-10-CM

## 2021-06-03 DIAGNOSIS — Z3402 Encounter for supervision of normal first pregnancy, second trimester: Secondary | ICD-10-CM | POA: Insufficient documentation

## 2021-06-03 IMAGING — US US OB COMP +14 WK
1 series · 13 of 28 positions shown · non-contrast
Comparison: none

CLINICAL DATA: Pregnancy.  Assess fetal anatomy

EXAM:
OBSTETRICAL ULTRASOUND >14 WKS

[Series 1: us ob comp + 14 wk · 13 of 106 slices shown]
[im 4/106]
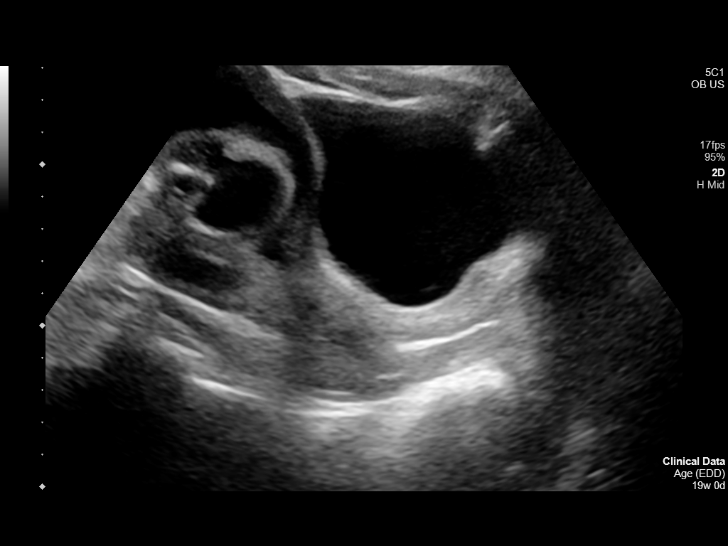
[im 12/106]
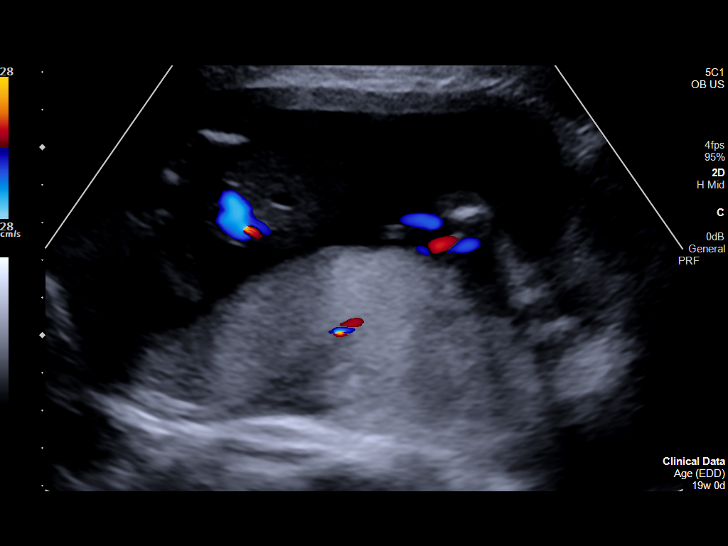
[im 20/106]
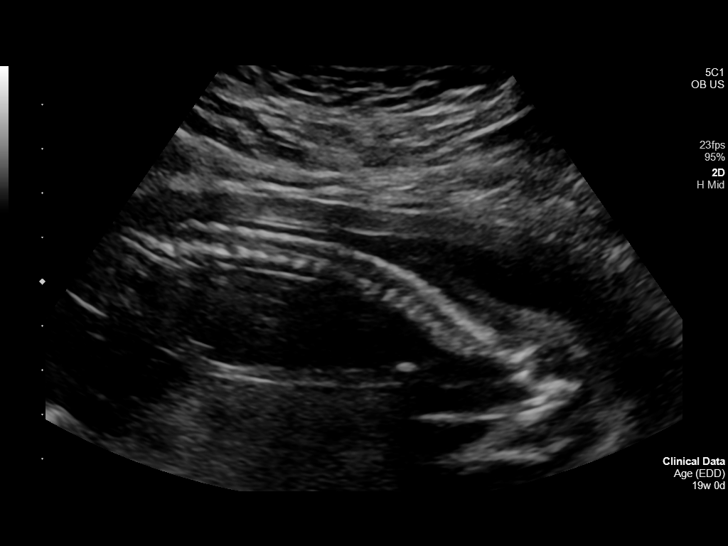
[im 28/106]
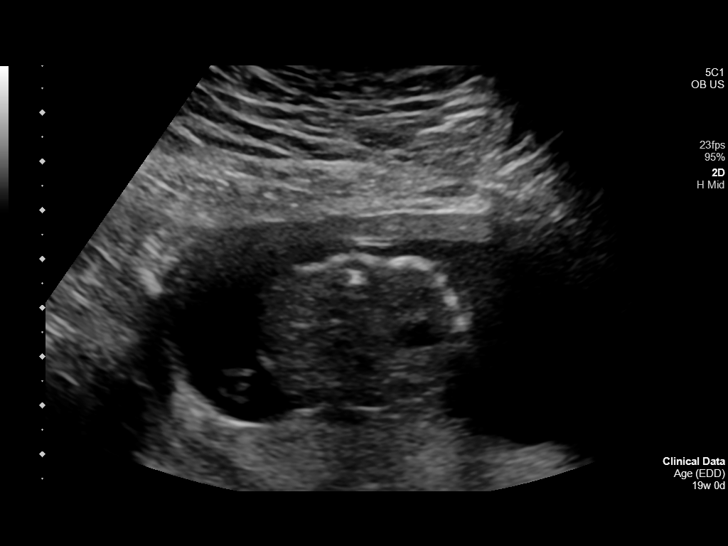
[im 36/106]
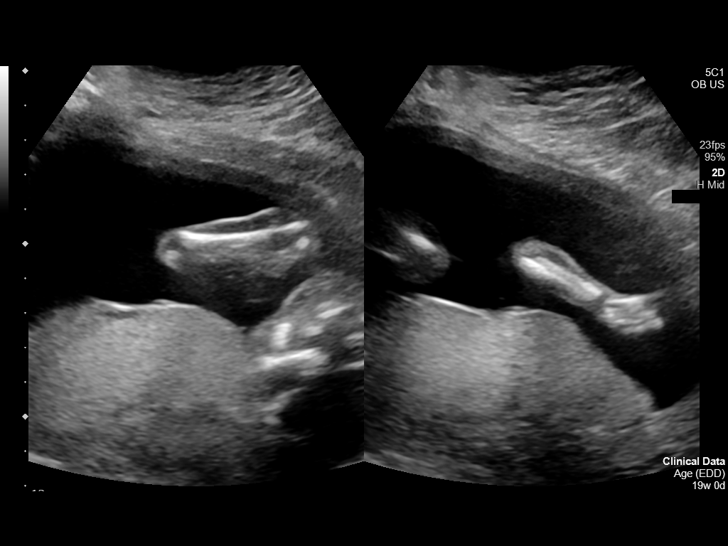
[im 43/106]
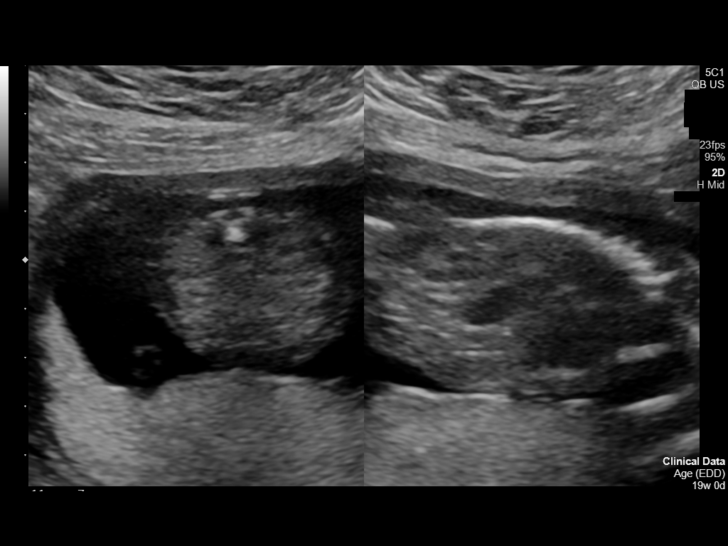
[im 55/106]
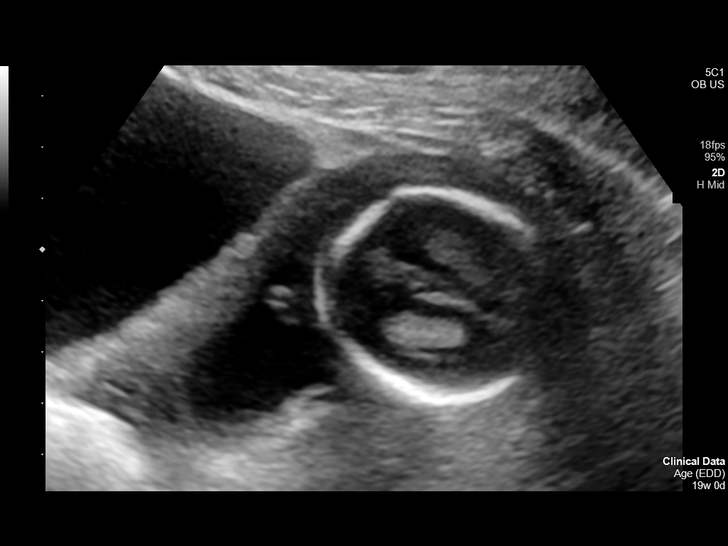
[im 63/106]
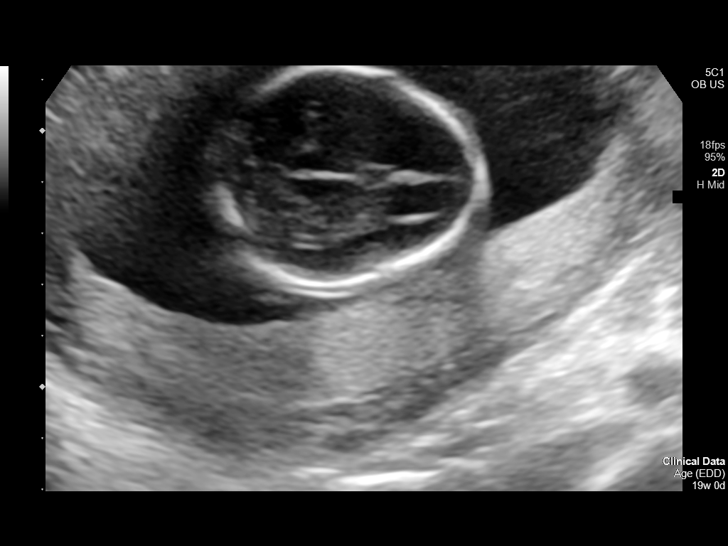
[im 71/106]
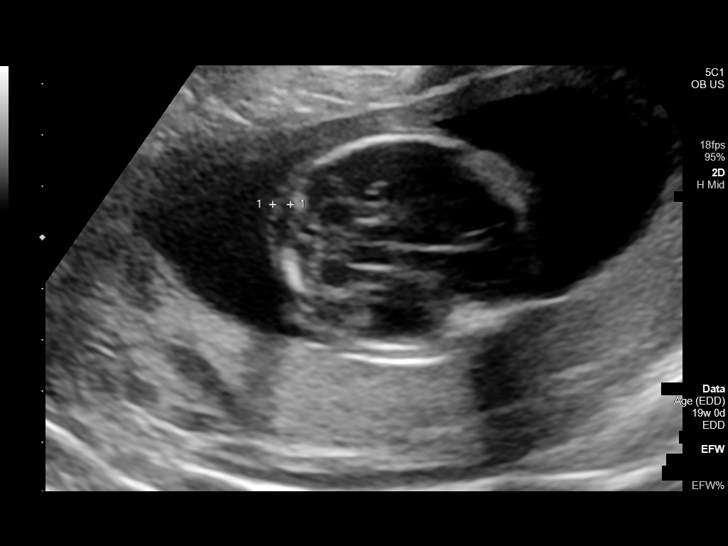
[im 78/106]
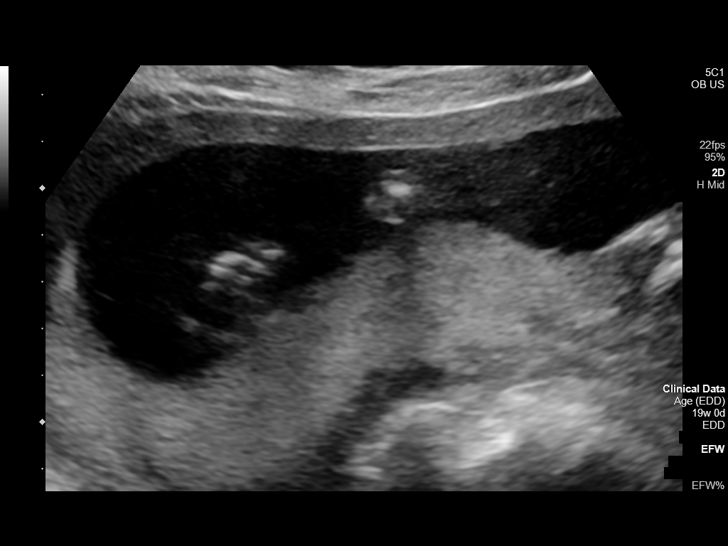
[im 86/106]
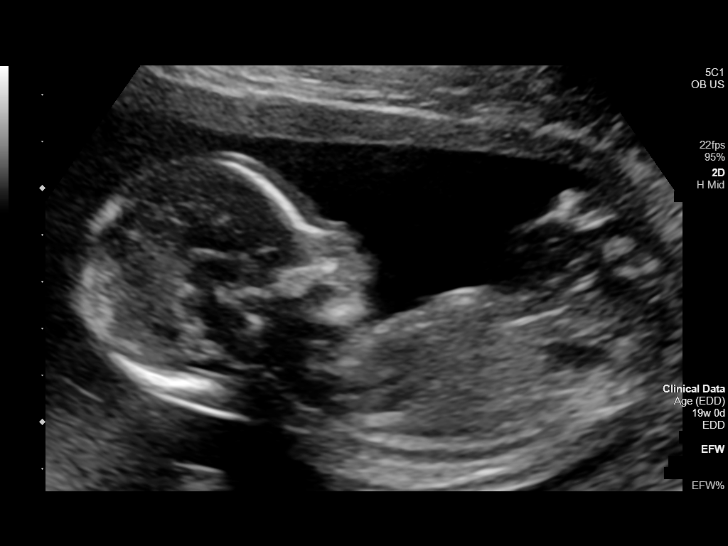
[im 94/106]
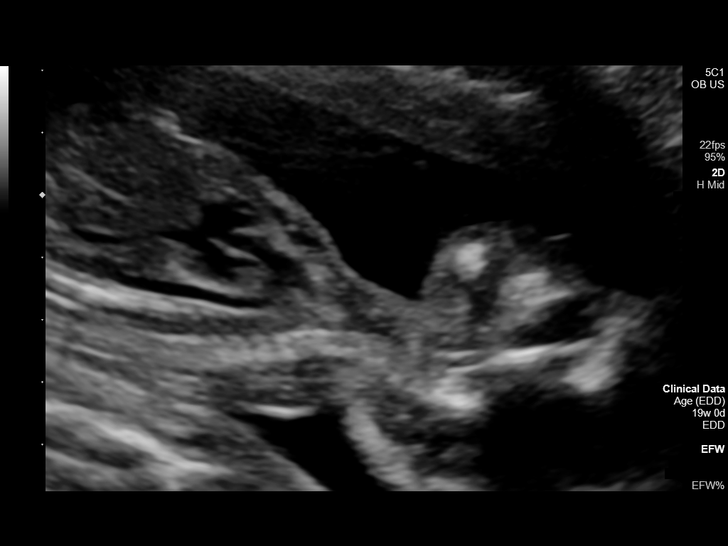
[im 102/106]
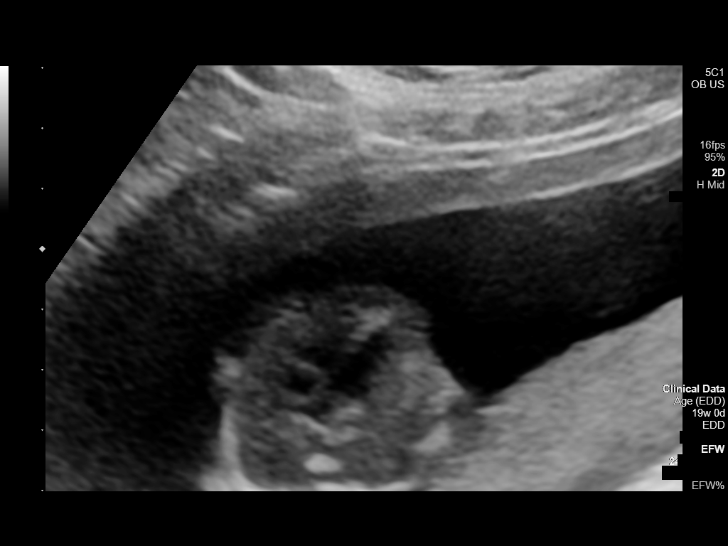

[13 of 28 positions shown; findings below may reference images not displayed]

FINDINGS: Number of Fetuses: 1

Heart Rate:  140 bpm

Movement: Yes

Presentation: Cephalic

Previa: No

Placental Location: Posterior

Amniotic Fluid (Subjective): Normal

Amniotic Fluid (Objective):

Vertical pocket = 4.0cm

FETAL BIOMETRY

BPD: 4.3cm 18w 6d

HC:   15.6cm 18w 4d

AC:   13.1cm 18w 4d

FL:   2.8cm 18w 4d

Current Mean GA: 18w 4d US EDC: [DATE]

Assigned GA:  19w 0d Assigned EDC: [DATE]

Estimated Fetal Weight:  248g

FETAL ANATOMY

Lateral Ventricles: Appears normal

Thalami/CSP: Appears normal

Posterior Fossa:  Appears normal

Nuchal Region: Appears normal   NFT= 3.5 mm

Upper Lip: Appears normal

Spine: Appears normal

4 Chamber Heart on Left: Not well visualized

LVOT: Not visualized

RVOT: Not visualized

Stomach on Left: Appears normal

3 Vessel Cord: Appears normal

Cord Insertion site: Appears normal

Kidneys: Appears normal

Bladder: Appears normal

Extremities: Appears normal

Sex: Male

Technically difficult due to: Fetal positioning

Maternal Findings:

Cervix:  Closed.  3.6 cm.
IMPRESSION: 1. Single live intrauterine gestation in cephalic presentation.
2. Assigned gestational age of 19 weeks 0 days. Adequate interval
growth.
3. Limited visualization of the fetal cardiac anatomy. Attention on
follow-up.
4. Remainder of the fetal anatomic survey is within normal limits.

## 2021-06-04 ENCOUNTER — Encounter: Payer: Self-pay | Admitting: Advanced Practice Midwife

## 2021-06-04 NOTE — Progress Notes (Signed)
Reviewed 06/03/21 u/s at 18 4/7 with no previa, posterior placenta, 3VC, anatomy wnl

## 2021-06-16 LAB — MISC LABCORP TEST (SEND OUT): Labcorp test code: 10801

## 2021-06-25 ENCOUNTER — Ambulatory Visit: Payer: Self-pay

## 2021-06-26 ENCOUNTER — Telehealth: Payer: Self-pay

## 2021-06-26 NOTE — Telephone Encounter (Signed)
Call to client to reschedule missed [facility] appt on 06/25/2021. Client requesting pm appt. Only 2 pm appts available and client selected the one on Thursday. Rich Number, RN

## 2021-07-02 ENCOUNTER — Ambulatory Visit: Payer: Self-pay

## 2021-07-09 ENCOUNTER — Other Ambulatory Visit: Payer: Self-pay

## 2021-07-09 ENCOUNTER — Ambulatory Visit: Payer: Self-pay | Admitting: Physician Assistant

## 2021-07-09 ENCOUNTER — Encounter: Payer: Self-pay | Admitting: Physician Assistant

## 2021-07-09 VITALS — BP 109/71 | HR 94 | Temp 98.6°F | Wt 189.0 lb

## 2021-07-09 DIAGNOSIS — R799 Abnormal finding of blood chemistry, unspecified: Secondary | ICD-10-CM

## 2021-07-09 DIAGNOSIS — O99210 Obesity complicating pregnancy, unspecified trimester: Secondary | ICD-10-CM

## 2021-07-09 DIAGNOSIS — Z3401 Encounter for supervision of normal first pregnancy, first trimester: Secondary | ICD-10-CM

## 2021-07-09 DIAGNOSIS — Z3402 Encounter for supervision of normal first pregnancy, second trimester: Secondary | ICD-10-CM

## 2021-07-09 DIAGNOSIS — O99212 Obesity complicating pregnancy, second trimester: Secondary | ICD-10-CM

## 2021-07-09 NOTE — Progress Notes (Signed)
[place] [facility]  PRENATAL VISIT NOTE  Subjective:  Leslie Powell is a 26 y.o. G2P0010 at [redacted]w[redacted]d being seen today for ongoing prenatal care.  She is currently monitored for the following issues for this high-risk pregnancy and has Obesity affecting pregnancy; Encounter for supervision of normal first pregnancy in first trimester; Abnormal blood finding--r/o hyperthyroidism; and Chlamydia trachomatis infection in pregnancy 04/28/21 on their problem list.  Patient reports  no complaints .  Contractions: Not present. Vag. Bleeding: None.  Movement: Present. Denies leaking of fluid/ROM.   The following portions of the patient's history were reviewed and updated as appropriate: allergies, current medications, past family history, past medical history, past social history, past surgical history and problem list. Problem list updated.  Objective:   Vitals:   07/09/21 1447  BP: 109/71  Pulse: 94  Temp: 98.6 F (37 C)  Weight: 189 lb (85.7 kg)    Fetal Status: Fetal Heart Rate (bpm): 150 Fundal Height: 25 cm Movement: Present     General:  Alert, oriented and cooperative. Patient is in no acute distress.  Skin: Skin is warm and dry. No rash noted.   Cardiovascular: Normal heart rate noted  Respiratory: Normal respiratory effort, no problems with respiration noted  Abdomen: Soft, gravid, appropriate for gestational age.  Pain/Pressure: Absent     Pelvic: Cervical exam deferred        Extremities: Normal range of motion.  Edema: Trace  Mental Status: Normal mood and affect. Normal behavior. Normal judgment and thought content.   Assessment and Plan:  Pregnancy: G2P0010 at [redacted]w[redacted]d  1. Encounter for supervision of normal first pregnancy in first trimester -26 year old female in clinic today for prenatal visit. -Patient states she is taking PNV daily. -No complaints noted today. -Reviewed with patient signs and symptoms of preterm  labor. -PHQ9= 0   2. Abnormal blood finding--r/o hyperthyroidism -05/14/21 consult regarding hyperthyroidism negative.  Per recommendations T3/T4 level repeated today.   - T3, free - T4, free  3. Obesity affecting pregnancy, antepartum -Total weight gain = 9 lb (4.082 kg)  -Reviewed with patient the importance of frequent exercise, limit salt intake, and incorporating foods high in protein and low in fat and sugar.  Patient also encouraged to drink plenty of water.     Term labor symptoms and general obstetric precautions including but not limited to vaginal bleeding, contractions, leaking of fluid and fetal movement were reviewed in detail with the patient. Please refer to After Visit Summary for other counseling recommendations.   Return in about 4 weeks (around 08/06/2021) for Routine prenatal care visit.   Gregary Cromer, FNP

## 2021-07-10 LAB — T4, FREE: Free T4: 0.72 ng/dL — ABNORMAL LOW (ref 0.82–1.77)

## 2021-07-10 LAB — T3, FREE: T3, Free: 2.4 pg/mL (ref 2.0–4.4)

## 2021-07-14 ENCOUNTER — Telehealth: Payer: Self-pay

## 2021-07-14 NOTE — Telephone Encounter (Signed)
RN called [facility] and added TSH lab per provider A. White, FNP request. Tawny Hopping, RN

## 2021-07-16 LAB — TSH: TSH: 0.709 u[IU]/mL (ref 0.450–4.500)

## 2021-07-16 LAB — SPECIMEN STATUS REPORT

## 2021-08-06 ENCOUNTER — Ambulatory Visit: Payer: Self-pay

## 2021-08-07 ENCOUNTER — Other Ambulatory Visit: Payer: Self-pay

## 2021-08-07 ENCOUNTER — Ambulatory Visit: Payer: Self-pay

## 2021-08-07 ENCOUNTER — Ambulatory Visit: Payer: Self-pay | Admitting: Family Medicine

## 2021-08-07 VITALS — BP 120/72 | HR 97 | Temp 97.0°F | Wt 191.4 lb

## 2021-08-07 DIAGNOSIS — O98312 Other infections with a predominantly sexual mode of transmission complicating pregnancy, second trimester: Secondary | ICD-10-CM

## 2021-08-07 DIAGNOSIS — Z3402 Encounter for supervision of normal first pregnancy, second trimester: Secondary | ICD-10-CM

## 2021-08-07 DIAGNOSIS — O99212 Obesity complicating pregnancy, second trimester: Secondary | ICD-10-CM

## 2021-08-07 DIAGNOSIS — Z3401 Encounter for supervision of normal first pregnancy, first trimester: Secondary | ICD-10-CM

## 2021-08-07 DIAGNOSIS — A568 Sexually transmitted chlamydial infection of other sites: Secondary | ICD-10-CM

## 2021-08-07 DIAGNOSIS — O99210 Obesity complicating pregnancy, unspecified trimester: Secondary | ICD-10-CM

## 2021-08-07 NOTE — Progress Notes (Signed)
Due to appt time, 28 week labs scheduled for 08/12/2021 with arrival time of 1045. Self-collecting 3 month chlamydia TOC. Jossie Ng, RN ? ?

## 2021-08-09 LAB — CHLAMYDIA/GC NAA, CONFIRMATION
Chlamydia trachomatis, NAA: NEGATIVE
Neisseria gonorrhoeae, NAA: NEGATIVE

## 2021-08-10 ENCOUNTER — Telehealth: Payer: Self-pay | Admitting: Advanced Practice Midwife

## 2021-08-10 NOTE — Progress Notes (Signed)
[facility] ?Maternal Health Clinic ? ?PRENATAL VISIT NOTE ? ?Subjective:  ?Leslie Powell is a 26 y.o. G2P0010 at [redacted]w[redacted]d being seen today for ongoing prenatal care.  She is currently monitored for the following issues for this high-risk pregnancy and has Obesity affecting pregnancy; Encounter for supervision of normal first pregnancy in first trimester; Abnormal blood finding--r/o hyperthyroidism; and Chlamydia trachomatis infection in pregnancy 04/28/21 on their problem list. ? ?Patient reports no complaints.  Contractions: Not present. Vag. Bleeding: None.  Movement: Present. Denies leaking of fluid/ROM.  ? ?The following portions of the patient's history were reviewed and updated as appropriate: allergies, current medications, past family history, past medical history, past social history, past surgical history and problem list. Problem list updated. ? ?Objective:  ? ?Vitals:  ? 08/07/21 1616  ?BP: 120/72  ?Pulse: 97  ?Temp: (!) 97 ?F (36.1 ?C)  ?Weight: 191 lb 6.4 oz (86.8 kg)  ? ? ?Fetal Status: Fetal Heart Rate (bpm): 156 Fundal Height: 29 cm Movement: Present    ? ?General:  Alert, oriented and cooperative. Patient is in no acute distress.  ?Skin: Skin is warm and dry. No rash noted.   ?Cardiovascular: Normal heart rate noted  ?Respiratory: Normal respiratory effort, no problems with respiration noted  ?Abdomen: Soft, gravid, appropriate for gestational age.  Pain/Pressure: Absent     ?Pelvic: Cervical exam deferred        ?Extremities: Normal range of motion.  Edema: None  ?Mental Status: Normal mood and affect. Normal behavior. Normal judgment and thought content.  ? ?Assessment and Plan:  ?Pregnancy: G2P0010 at [redacted]w[redacted]d ? ?1. Encounter for supervision of normal first pregnancy in first trimester ?Taking PNV as directed  ?Is not taking ASA  ?-28 wk labs not collected today d/t time, has appointment scheduled to RTC or lab collection  ?-PHQ-9 score was 3 , 3 months ago  ?-Reviewed  prenatal visit schedule q2 wks until 36 wks, then q1 wk.  ? ?2. Chlamydia trachomatis infection in mother during second trimester of pregnancy ?TOC today  ?- Chlamydia/GC NAA, Confirmation ? ?3. Obesity affecting pregnancy, antepartum ?Reports not exercising, discussed importance of exercise during pregnancy,  ?Discussed walking or using You Tube videos, incorporating partner for accountability  ? ?Preterm labor symptoms and general obstetric precautions including but not limited to vaginal bleeding, contractions, leaking of fluid and fetal movement were reviewed in detail with the patient. ?Please refer to After Visit Summary for other counseling recommendations.  ?No follow-ups on file. ? ?Future Appointments  ?Date Time Provider Department Center  ?08/12/2021 11:00 AM [facility] NURSE [facility] None  ?08/24/2021  9:00 AM [facility] PROVIDER [facility] None  ? ? ?Wendi Snipes, FNP ? ?

## 2021-08-11 NOTE — Telephone Encounter (Signed)
Telephone call to patient this morning regarding her 28 week lab appointment tomorrow 08-12-2021 at 11 am.  Patient cannot make her appointment due to her work schedule.  She can come next week.  I explained to her next week is better for labs than pushing it out until her scheduled appointment on 08-24-2021.  She will call the appointment line number to schedule for early next week. Hart Carwin, RN ? ?

## 2021-08-12 ENCOUNTER — Other Ambulatory Visit: Payer: Self-pay

## 2021-08-19 ENCOUNTER — Other Ambulatory Visit: Payer: Self-pay

## 2021-08-19 DIAGNOSIS — Z3401 Encounter for supervision of normal first pregnancy, first trimester: Secondary | ICD-10-CM

## 2021-08-19 DIAGNOSIS — Z3403 Encounter for supervision of normal first pregnancy, third trimester: Secondary | ICD-10-CM

## 2021-08-19 LAB — HEMOGLOBIN, FINGERSTICK: Hemoglobin: 11.9 g/dL (ref 11.1–15.9)

## 2021-08-19 NOTE — Progress Notes (Signed)
In Nurse Clinic for 28 week labs, including 1 hr GTT, HIV, RPR, Hgb.  ? ?Declines Tdap today, says she wants Tdap after delivery.  ? ?Pt drank glucose drink in one minute finishing at 8:48 am. Due for lab draw at 9:48 am, pt aware and lab notified. Instructions given for test today and advised to be npo while here.  ? ?Pt returned to Nurse Clinic after lab with hgb results. Hgb 11.9 g/dl . Per SO Dr Ralene Bathe, no further tx indicated. Advised pt to continue PNV. Next [facility] RV appt 08/24/2021 at 9 am. Pt aware. Jerel Shepherd, RN ? ?

## 2021-08-20 LAB — GLUCOSE, 1 HOUR GESTATIONAL: Gestational Diabetes Screen: 144 mg/dL — ABNORMAL HIGH (ref 70–139)

## 2021-08-20 LAB — RPR: RPR Ser Ql: NONREACTIVE

## 2021-08-21 LAB — HIV-1/HIV-2 QUALITATIVE RNA
HIV-1 RNA, Qualitative: NONREACTIVE
HIV-2 RNA, Qualitative: NONREACTIVE

## 2021-08-24 ENCOUNTER — Ambulatory Visit: Payer: Self-pay | Admitting: Advanced Practice Midwife

## 2021-08-24 VITALS — BP 119/78 | HR 104 | Temp 98.2°F | Wt 197.8 lb

## 2021-08-24 DIAGNOSIS — A568 Sexually transmitted chlamydial infection of other sites: Secondary | ICD-10-CM

## 2021-08-24 DIAGNOSIS — Z3401 Encounter for supervision of normal first pregnancy, first trimester: Secondary | ICD-10-CM

## 2021-08-24 DIAGNOSIS — O99810 Abnormal glucose complicating pregnancy: Secondary | ICD-10-CM

## 2021-08-24 DIAGNOSIS — O98312 Other infections with a predominantly sexual mode of transmission complicating pregnancy, second trimester: Secondary | ICD-10-CM

## 2021-08-24 DIAGNOSIS — O99210 Obesity complicating pregnancy, unspecified trimester: Secondary | ICD-10-CM

## 2021-08-24 DIAGNOSIS — Z3403 Encounter for supervision of normal first pregnancy, third trimester: Secondary | ICD-10-CM

## 2021-08-24 DIAGNOSIS — R799 Abnormal finding of blood chemistry, unspecified: Secondary | ICD-10-CM

## 2021-08-24 DIAGNOSIS — O99213 Obesity complicating pregnancy, third trimester: Secondary | ICD-10-CM

## 2021-08-24 NOTE — Progress Notes (Signed)
[facility] Department ?Maternal Health Clinic ? ?PRENATAL VISIT NOTE ? ?Subjective:  ?Leslie Powell is a 26 y.o. G2P0010 at [redacted]w[redacted]d being seen today for ongoing prenatal care.  She is currently monitored for the following issues for this low-risk pregnancy and has Obesity affecting pregnancy; Encounter for supervision of normal first pregnancy in first trimester; Abnormal blood finding--r/o hyperthyroidism; Chlamydia trachomatis infection in pregnancy 04/28/21; and Abnormal glucose affecting pregnancy on their problem list. ? ?Patient reports  leg cramps .  Contractions: Not present. Vag. Bleeding: None.  Movement: Present. Denies leaking of fluid/ROM.  ? ?The following portions of the patient's history were reviewed and updated as appropriate: allergies, current medications, past family history, past medical history, past social history, past surgical history and problem list. Problem list updated. ? ?Objective:  ? ?Vitals:  ? 08/24/21 0903  ?BP: 119/78  ?Pulse: (!) 104  ?Temp: 98.2 ?F (36.8 ?C)  ?Weight: 197 lb 12.8 oz (89.7 kg)  ? ? ?Fetal Status: Fetal Heart Rate (bpm): 140 Fundal Height: 32 cm Movement: Present    ? ?General:  Alert, oriented and cooperative. Patient is in no acute distress.  ?Skin: Skin is warm and dry. No rash noted.   ?Cardiovascular: Normal heart rate noted  ?Respiratory: Normal respiratory effort, no problems with respiration noted  ?Abdomen: Soft, gravid, appropriate for gestational age.  Pain/Pressure: Absent     ?Pelvic: Cervical exam deferred        ?Extremities: Normal range of motion.  Edema: None  ?Mental Status: Normal mood and affect. Normal behavior. Normal judgment and thought content.  ? ?Assessment and Plan:  ?Pregnancy: G2P0010 at [redacted]w[redacted]d ? ?1. Abnormal blood finding--r/o hyperthyroidism ?07/09/21 TSH wnl, free T3 wnl, free T4 low ?Repeat today ?- TSH + free T4 ? ?2. Abnormal glucose affecting pregnancy ?1 hour glucola=144 on 08/19/21 ?3 hour GTT on  08/26/21 ? ?3. Encounter for supervision of normal first pregnancy in first trimester ?Working 30 hrs/wk ?Baby shower 09/05/21 ? ?4. Obesity affecting pregnancy, antepartum ?Not taking ASA 81 mg daily ?17 lb 12.8 oz (8.074 kg) ?Walking 3-4x/wk x 20-30 min ? ?5. Chlamydia trachomatis infection in mother during second trimester of pregnancy ?Neg on 08/07/21 test for reinfection ? ? ?Preterm labor symptoms and general obstetric precautions including but not limited to vaginal bleeding, contractions, leaking of fluid and fetal movement were reviewed in detail with the patient. ?Please refer to After Visit Summary for other counseling recommendations.  ?No follow-ups on file. ? ?Future Appointments  ?Date Time Provider Department Center  ?08/26/2021  8:20 AM [facility] NURSE [facility] None  ? ? ?Alberteen Spindle, CNM ? ?

## 2021-08-24 NOTE — Progress Notes (Signed)
Patient here for MH RV at 40 5/7. States she has Tdap VIS and declines at this time.  Needs 3 hour gtt due to 1 hour = 144. Scheduled for 08/26/2021. Reviewed fasting instructions for 3 hour gtt.Jenetta Downer, RN  ?

## 2021-08-25 LAB — TSH+FREE T4
Free T4: 0.61 ng/dL — ABNORMAL LOW (ref 0.82–1.77)
TSH: 0.935 u[IU]/mL (ref 0.450–4.500)

## 2021-08-26 ENCOUNTER — Other Ambulatory Visit: Payer: Self-pay

## 2021-08-26 DIAGNOSIS — O99810 Abnormal glucose complicating pregnancy: Secondary | ICD-10-CM

## 2021-08-26 NOTE — Progress Notes (Signed)
In Nurse Clinic for 3hr GTT. Reports npo since 8 pm last night. Instructions given for test today. Advised to notify RN if N/V. Questions answered and reports understanding. Next [facility] RV appt 09/11/2021. Jerel Shepherd, RN ? ?

## 2021-08-27 LAB — GLUCOSE TOLERANCE TEST, 6 HOUR
Glucose, 1 Hour GTT: 127 mg/dL (ref 70–199)
Glucose, 2 hour: 106 mg/dL (ref 70–139)
Glucose, 3 hour: 98 mg/dL (ref 70–109)
Glucose, GTT - Fasting: 75 mg/dL (ref 70–99)

## 2021-08-27 NOTE — Progress Notes (Signed)
Reviewed 3 h GTT, WNL. Problem list updated. Plan to review with patient at next prenatal visit. No follow up needed.

## 2021-09-11 ENCOUNTER — Ambulatory Visit: Payer: Self-pay | Admitting: Advanced Practice Midwife

## 2021-09-11 VITALS — BP 125/87 | HR 96 | Temp 97.4°F | Wt 201.6 lb

## 2021-09-11 DIAGNOSIS — Z3401 Encounter for supervision of normal first pregnancy, first trimester: Secondary | ICD-10-CM

## 2021-09-11 DIAGNOSIS — O99213 Obesity complicating pregnancy, third trimester: Secondary | ICD-10-CM

## 2021-09-11 DIAGNOSIS — O99210 Obesity complicating pregnancy, unspecified trimester: Secondary | ICD-10-CM

## 2021-09-11 DIAGNOSIS — O99810 Abnormal glucose complicating pregnancy: Secondary | ICD-10-CM

## 2021-09-11 DIAGNOSIS — Z3403 Encounter for supervision of normal first pregnancy, third trimester: Secondary | ICD-10-CM

## 2021-09-11 NOTE — Progress Notes (Signed)
Urine dip reviewed - no intervention required per standing order. Rich Number, RN ? ?

## 2021-09-11 NOTE — Progress Notes (Signed)
[facility] Department ?Maternal Health Clinic ? ?PRENATAL VISIT NOTE ? ?Subjective:  ?Leslie Powell is a 26 y.o. G2P0010 at [redacted]w[redacted]d being seen today for ongoing prenatal care.  She is currently monitored for the following issues for this low-risk pregnancy and has Obesity affecting pregnancy; Encounter for supervision of normal first pregnancy in first trimester; Abnormal blood finding--r/o hyperthyroidism; Chlamydia trachomatis infection in pregnancy 04/28/21; and Abnormal glucose affecting pregnancy on their problem list. ? ?Patient reports no complaints.  Contractions: Not present. Vag. Bleeding: None.  Movement: Present. Denies leaking of fluid/ROM.  ? ?The following portions of the patient's history were reviewed and updated as appropriate: allergies, current medications, past family history, past medical history, past social history, past surgical history and problem list. Problem list updated. ? ?Objective:  ? ?Vitals:  ? 09/11/21 1601  ?BP: 125/87  ?Pulse: 96  ?Temp: (!) 97.4 ?F (36.3 ?C)  ?Weight: 201 lb 9.6 oz (91.4 kg)  ? ? ?Fetal Status: Fetal Heart Rate (bpm): 135 Fundal Height: 35 cm Movement: Present  Presentation: Vertex ? ?General:  Alert, oriented and cooperative. Patient is in no acute distress.  ?Skin: Skin is warm and dry. No rash noted.   ?Cardiovascular: Normal heart rate noted  ?Respiratory: Normal respiratory effort, no problems with respiration noted  ?Abdomen: Soft, gravid, appropriate for gestational age.  Pain/Pressure: Absent     ?Pelvic: Cervical exam deferred        ?Extremities: Normal range of motion.  Edema: Trace  ?Mental Status: Normal mood and affect. Normal behavior. Normal judgment and thought content.  ? ?Assessment and Plan:  ?Pregnancy: G2P0010 at [redacted]w[redacted]d ? ?1. Encounter for supervision of normal first pregnancy in first trimester ?Working 30 hrs/wk ?Tr LE edema--u/a today ?Dinner last night had 4 enchiladas, grape soda ?Snack: 1 lemonaide ?132/83,  125/87 ?Had baby shower 09/05/21 and has car seat and ready for baby at home ?Walking 3-4x/wk x 20-30 min ?- Urinalysis (Urine Dip) ? ?2. Abnormal glucose affecting pregnancy ?3 hour GTT on 08/26/21=wnl ? ?3. Obesity affecting pregnancy, antepartum ?21 lb 9.6 oz (9.798 kg) ?4 lb wt gain in last 3 wks ?Not taking ASA 81 mg ? ? ?Preterm labor symptoms and general obstetric precautions including but not limited to vaginal bleeding, contractions, leaking of fluid and fetal movement were reviewed in detail with the patient. ?Please refer to After Visit Summary for other counseling recommendations.  ?Return in about 2 weeks (around 09/25/2021) for 36 wk labs, routine PNC. ? ?No future appointments. ? ?Herbie Saxon, CNM ?

## 2021-09-14 LAB — URINALYSIS
Bilirubin, UA: NEGATIVE
Glucose, UA: NEGATIVE
Ketones, UA: NEGATIVE
Nitrite, UA: NEGATIVE
Protein,UA: NEGATIVE
RBC, UA: NEGATIVE
Specific Gravity, UA: 1.02 (ref 1.005–1.030)
Urobilinogen, Ur: 0.2 mg/dL (ref 0.2–1.0)
pH, UA: 6.5 (ref 5.0–7.5)

## 2021-09-25 ENCOUNTER — Encounter: Payer: Self-pay | Admitting: Nurse Practitioner

## 2021-09-25 ENCOUNTER — Ambulatory Visit: Payer: Self-pay | Admitting: Nurse Practitioner

## 2021-09-25 VITALS — BP 128/91 | HR 104 | Temp 97.7°F | Wt 208.6 lb

## 2021-09-25 DIAGNOSIS — Z3403 Encounter for supervision of normal first pregnancy, third trimester: Secondary | ICD-10-CM

## 2021-09-25 DIAGNOSIS — O99210 Obesity complicating pregnancy, unspecified trimester: Secondary | ICD-10-CM

## 2021-09-25 NOTE — Progress Notes (Signed)
[facility] Department ?Maternal Health Clinic ? ?PRENATAL VISIT NOTE ? ?Subjective:  ?Leslie Powell is a 26 y.o. G2P0010 at [redacted]w[redacted]d being seen today for ongoing prenatal care.  She is currently monitored for the following issues for this low-risk pregnancy and has Obesity affecting pregnancy; Encounter for supervision of normal first pregnancy in first trimester; Abnormal blood finding--r/o hyperthyroidism; Chlamydia trachomatis infection in pregnancy 04/28/21; and Abnormal glucose affecting pregnancy on their problem list. ? ?Patient reports no complaints.  Contractions: Not present. Vag. Bleeding: None.  Movement: Present. Denies leaking of fluid/ROM.  ? ?The following portions of the patient's history were reviewed and updated as appropriate: allergies, current medications, past family history, past medical history, past social history, past surgical history and problem list. Problem list updated. ? ?Objective:  ? ?Vitals:  ? 09/25/21 1345  ?BP: (!) 128/91  ?Pulse: (!) 104  ?Temp: 97.7 ?F (36.5 ?C)  ?Weight: 208 lb 9.6 oz (94.6 kg)  ? ? ?Fetal Status: Fetal Heart Rate (bpm): 150 Fundal Height: 36 cm Movement: Present  Presentation: Vertex ? ?General:  Alert, oriented and cooperative. Patient is in no acute distress.  ?Skin: Skin is warm and dry. No rash noted.   ?Cardiovascular: Normal heart rate noted  ?Respiratory: Normal respiratory effort, no problems with respiration noted  ?Abdomen: Soft, gravid, appropriate for gestational age.  Pain/Pressure: Absent     ?Pelvic: Cervical exam deferred        ?Extremities: Normal range of motion.  Edema: Mild pitting, slight indentation  ?Mental Status: Normal mood and affect. Normal behavior. Normal judgment and thought content.  ? ?Assessment and Plan:  ?Pregnancy: G2P0010 at [redacted]w[redacted]d ? ?1. Encounter for supervision of normal first pregnancy in third trimester ?-26 year old female in clinic today for prenatal care. ?-Patient states she is taking her PNV  daily. ?-No complaints. ?-BP elevated 128/91, recheck BP 124/94.  Denies epigastric pain, floaters, and headaches.  Has 1+ edema to lower extremities.  Patient sent to lab for urine dip.  Urine dip= 1+ leukocytes.   ?-Patient states she has booked a flight to travel to [place] for the weekend.   Consulted with Dr. Dalbert Garnet regarding traveling.  Per Dr. Dalbert Garnet ok for patient to fly to [place].  Advised to report to local hospital, if signs and symptoms of preeclampsia develops.  Patient to also monitor edema.   ?-Patient to return to clinic in one week for BP evaluation.   ? ?- Urinalysis (Urine Dip) ? ?2. Obesity affecting pregnancy, antepartum ?-Total weight gain since last visit was 7lbs.  Patient states she has been a lot more hungrier than usual.  Patient admits to eats more snacks and sugary drinks. Advised to limit carbs and sugar intake.  Frequent exercise, at least 3 times a day and drink 6-8 glasses of water.  ?-28 lb 9.6 oz (13 kg)  ? ? ?Term labor symptoms and general obstetric precautions including but not limited to vaginal bleeding, contractions, leaking of fluid and fetal movement were reviewed in detail with the patient. ?Please refer to After Visit Summary for other counseling recommendations.  ? ?Return in about 1 week (around 10/02/2021) for Routine prenatal care visit. ? ?Future Appointments  ?Date Time Provider Department Center  ?10/02/2021  3:40 PM [facility] PROVIDER [facility] None  ? ? ?Glenna Fellows, FNP ? ?

## 2021-09-25 NOTE — Progress Notes (Signed)
No complaints. Repeat manual BP same arm (left) 124/94. Patient would like travel advice.  ?

## 2021-09-25 NOTE — Progress Notes (Signed)
Here for prenatal visit at 35 2/7 ?

## 2021-09-28 LAB — URINALYSIS
Bilirubin, UA: NEGATIVE
Glucose, UA: NEGATIVE
Ketones, UA: NEGATIVE
Nitrite, UA: NEGATIVE
Protein,UA: NEGATIVE
RBC, UA: NEGATIVE
Specific Gravity, UA: 1.025 (ref 1.005–1.030)
Urobilinogen, Ur: 0.2 mg/dL (ref 0.2–1.0)
pH, UA: 7 (ref 5.0–7.5)

## 2021-10-02 ENCOUNTER — Ambulatory Visit: Payer: Self-pay

## 2021-10-02 NOTE — Progress Notes (Unsigned)
Pap was NIL. Next pap in 3 years.

## 2021-10-15 ENCOUNTER — Encounter
Admission: EM | Disposition: A | Discharge status: Home / Self Care | Payer: Self-pay | Source: Home / Self Care | Attending: Emergency Medicine

## 2021-10-15 ENCOUNTER — Emergency Department: Payer: Self-pay

## 2021-10-15 ENCOUNTER — Observation Stay
Admission: EM | Admit: 2021-10-15 | Discharge: 2021-10-17 | Disposition: A | Discharge status: Home / Self Care | Payer: Self-pay | Attending: Surgery | Admitting: Surgery

## 2021-10-15 ENCOUNTER — Observation Stay: Payer: Self-pay | Admitting: Anesthesiology

## 2021-10-15 ENCOUNTER — Encounter: Payer: Self-pay | Admitting: Emergency Medicine

## 2021-10-15 ENCOUNTER — Other Ambulatory Visit: Payer: Self-pay

## 2021-10-15 DIAGNOSIS — K8012 Calculus of gallbladder with acute and chronic cholecystitis without obstruction: Principal | ICD-10-CM | POA: Insufficient documentation

## 2021-10-15 DIAGNOSIS — K8020 Calculus of gallbladder without cholecystitis without obstruction: Secondary | ICD-10-CM

## 2021-10-15 DIAGNOSIS — K810 Acute cholecystitis: Secondary | ICD-10-CM | POA: Diagnosis present

## 2021-10-15 DIAGNOSIS — K851 Biliary acute pancreatitis without necrosis or infection: Secondary | ICD-10-CM | POA: Insufficient documentation

## 2021-10-15 LAB — COMPREHENSIVE METABOLIC PANEL
ALT: 23 U/L (ref 0–44)
AST: 26 U/L (ref 15–41)
Albumin: 3.7 g/dL (ref 3.5–5.0)
Alkaline Phosphatase: 84 U/L (ref 38–126)
Anion gap: 7 (ref 5–15)
BUN: 13 mg/dL (ref 6–20)
CO2: 26 mmol/L (ref 22–32)
Calcium: 9.4 mg/dL (ref 8.9–10.3)
Chloride: 108 mmol/L (ref 98–111)
Creatinine, Ser: 0.59 mg/dL (ref 0.44–1.00)
GFR, Estimated: >60 mL/min (ref >60)
Glucose, Bld: 110 mg/dL — ABNORMAL HIGH (ref 70–99)
Potassium: 3.9 mmol/L (ref 3.5–5.1)
Sodium: 141 mmol/L (ref 135–145)
Total Bilirubin: 0.8 mg/dL (ref 0.3–1.2)
Total Protein: 7.1 g/dL (ref 6.5–8.1)

## 2021-10-15 LAB — CBC
HCT: 37.9 % (ref 36.0–46.0)
Hemoglobin: 11.8 g/dL — ABNORMAL LOW (ref 12.0–15.0)
MCH: 25.8 pg — ABNORMAL LOW (ref 26.0–34.0)
MCHC: 31.1 g/dL (ref 30.0–36.0)
MCV: 82.8 fL (ref 80.0–100.0)
Platelets: 380 10*3/uL (ref 150–400)
RBC: 4.58 MIL/uL (ref 3.87–5.11)
RDW: 15.4 % (ref 11.5–15.5)
WBC: 12.9 10*3/uL — ABNORMAL HIGH (ref 4.0–10.5)
nRBC: 0 % (ref 0.0–0.2)

## 2021-10-15 LAB — URINALYSIS, ROUTINE W REFLEX MICROSCOPIC
Bilirubin Urine: NEGATIVE
Glucose, UA: NEGATIVE mg/dL
Hgb urine dipstick: NEGATIVE
Ketones, ur: NEGATIVE mg/dL
Leukocytes,Ua: NEGATIVE
Nitrite: NEGATIVE
Protein, ur: NEGATIVE mg/dL
Specific Gravity, Urine: 1.019 (ref 1.005–1.030)
pH: 6 (ref 5.0–8.0)

## 2021-10-15 LAB — LIPASE, BLOOD: Lipase: 41 U/L (ref 11–51)

## 2021-10-15 LAB — POC URINE PREG, ED: Preg Test, Ur: NEGATIVE

## 2021-10-15 IMAGING — MR MR ABDOMEN WO/W CM MRCP
18 series · 48 of 48 positions shown · IV contrast (8ml Gadavist)
Comparison: No prior abdominal MRI. Abdominal ultrasound
[DATE].

CLINICAL DATA: 25-year-old female with history of lower abdominal
pain with radiation into the back, with some associated nausea. No
emesis.

EXAM:
MRI ABDOMEN WITHOUT AND WITH CONTRAST (INCLUDING MRCP)
TECHNIQUE: Multiplanar multisequence MR imaging of the abdomen was performed
both before and after the administration of intravenous contrast.
Heavily T2-weighted images of the biliary and pancreatic ducts were
obtained, and three-dimensional MRCP images were rendered by post
processing.
CONTRAST:  8mL GADAVIST GADOBUTROL 1 MMOL/ML IV SOLN

[Series 3: T2 · coronal · 6.0mm · 1.19mm/px · 2 of 30 slices shown (1 of 2)]
[im 1/30]
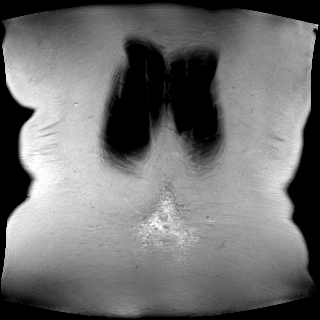
[im 30/30]
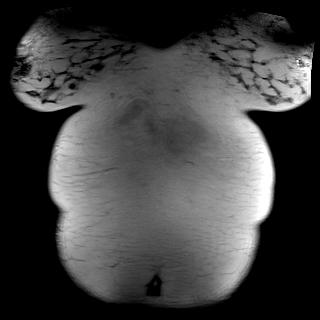

[Series 4: T2 · axial · 6.0mm · 1.19mm/px · z∈[-71,+166]mm · 3 of 34 slices shown (2 of 2)]
[im 1/34]
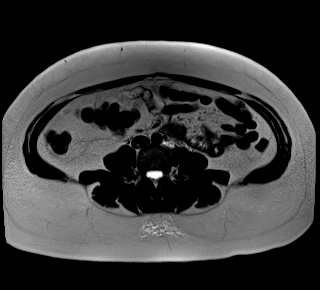
[im 17/34]
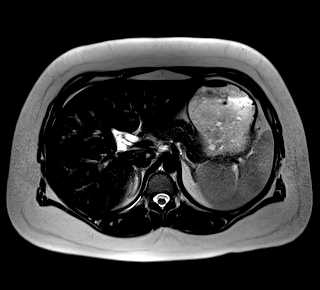
[im 34/34]
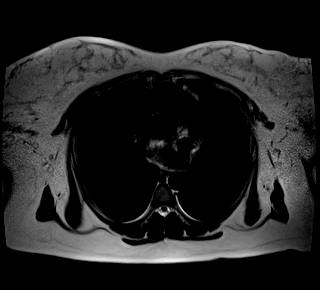

[Series 5: T1 · axial · 3.0mm · 1.19mm/px · z∈[-62,+157]mm · 3 of 74 slices shown (1 of 2)]
[im 1/74]
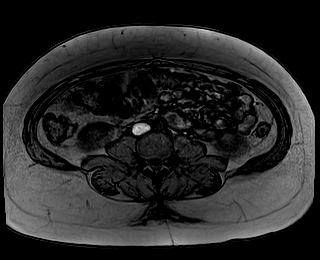
[im 37/74]
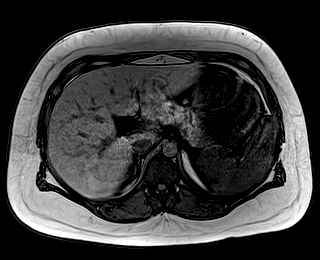
[im 74/74]
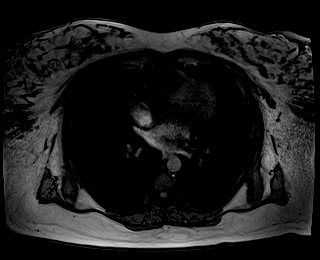

[Series 6: T1 · axial · 3.0mm · 1.19mm/px · z∈[-62,+157]mm · 3 of 74 slices shown (2 of 2)]
[im 1/74]
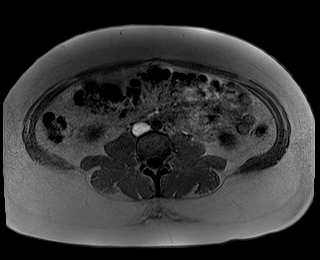
[im 37/74]
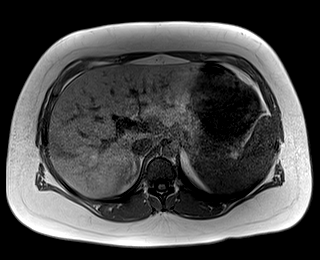
[im 74/74]
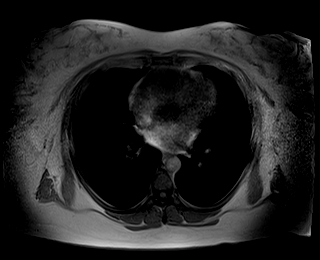

[Series 9: T2 fat-sat · axial · 6.0mm · 1.19mm/px · 1 of 33 slices shown]
[im 1/33]
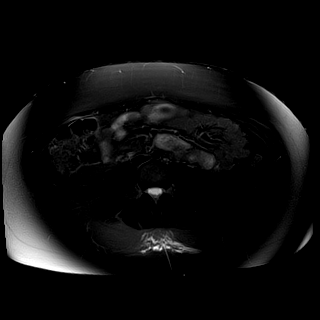

[Series 10: radials · oblique · 50.0mm · 0.78mm/px · 1 of 5 slices shown]
[im 1/5]
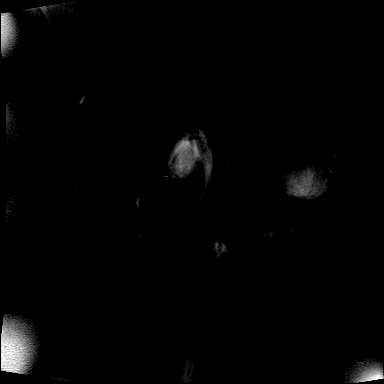

[Series 11: MRCP · coronal · 3.0mm · 1.12mm/px · 1 of 17 slices shown]
[im 1/17]
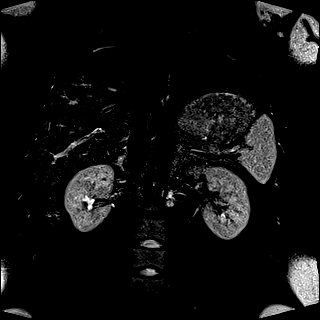

[Series 12: ax dwi_tracew · axial · 6.0mm · 1.42mm/px · z∈[-71,+166]mm · 5 of 102 slices shown]
[im 1/102]
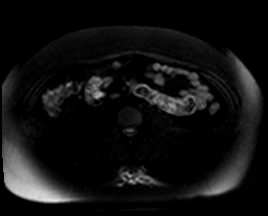
[im 26/102]
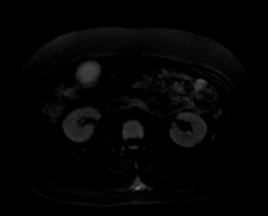
[im 51/102]
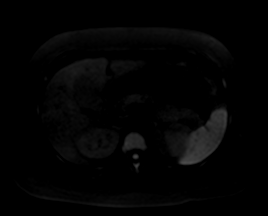
[im 76/102]
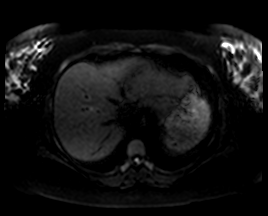
[im 102/102]
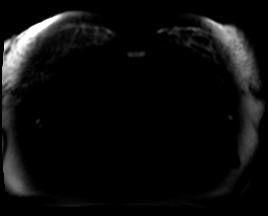

[Series 13: ax dwi_adc · axial · 6.0mm · 1.42mm/px · z∈[-71,+166]mm · 2 of 34 slices shown]
[im 1/34]
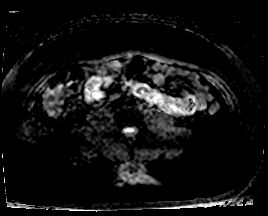
[im 34/34]
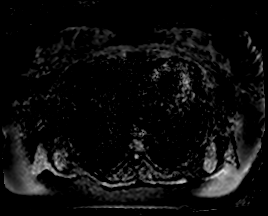

[Series 19: T1 dynamic fat-sat post-contrast · axial · 3.0mm · 1.19mm/px · z∈[-65,+160]mm · 3 of 76 slices shown (1 of 4)]
[im 1/76]
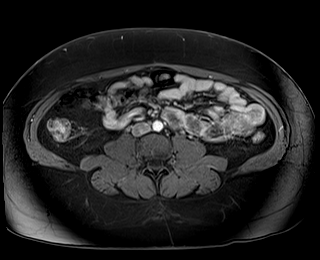
[im 38/76]
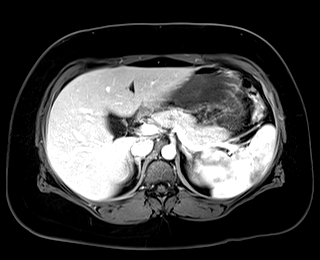
[im 76/76]
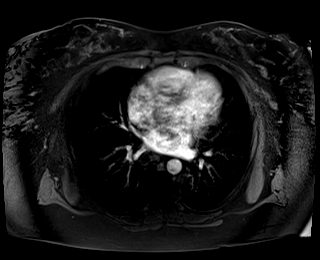

[Series 20: T1 dynamic fat-sat · axial · 3.0mm · 1.19mm/px · z∈[-65,+160]mm · 3 of 76 slices shown (1 of 4)]
[im 1/76]
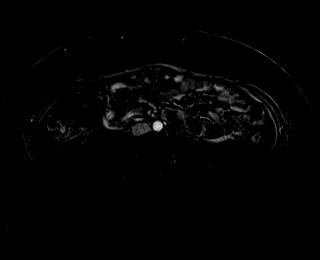
[im 38/76]
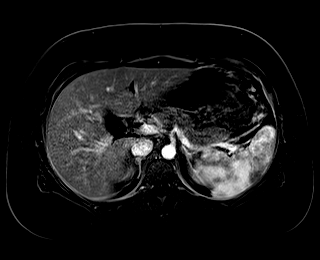
[im 76/76]
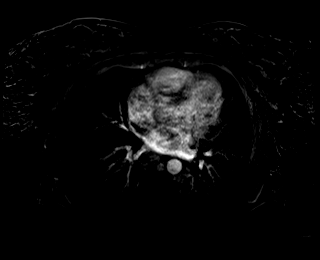

[Series 21: T1 dynamic fat-sat post-contrast · axial · 3.0mm · 1.19mm/px · z∈[-65,+160]mm · 3 of 76 slices shown (2 of 4)]
[im 1/76]
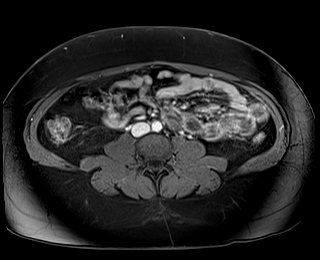
[im 38/76]
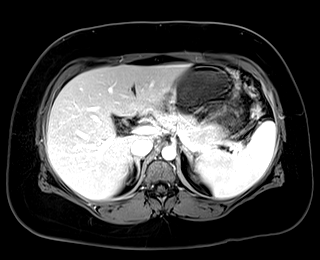
[im 76/76]
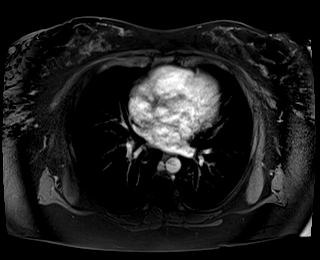

[Series 22: T1 dynamic fat-sat · axial · 3.0mm · 1.19mm/px · z∈[-65,+160]mm · 3 of 76 slices shown (2 of 4)]
[im 1/76]
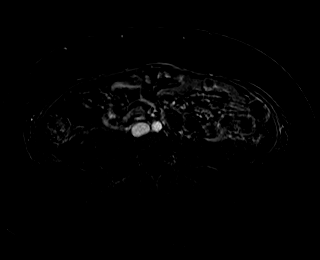
[im 38/76]
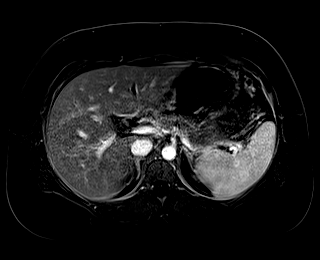
[im 76/76]
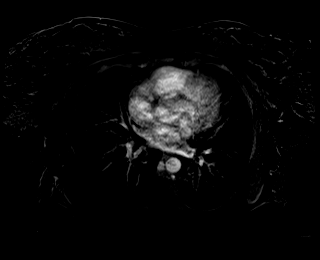

[Series 23: T1 dynamic fat-sat post-contrast · axial · 3.0mm · 1.19mm/px · z∈[-65,+160]mm · 3 of 76 slices shown (3 of 4)]
[im 1/76]
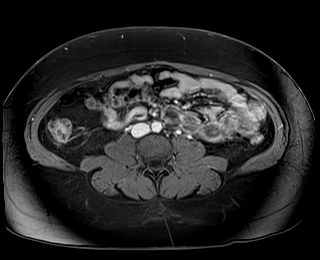
[im 38/76]
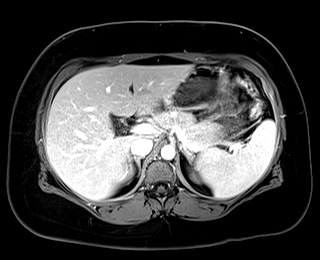
[im 76/76]
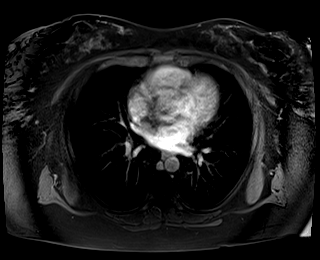

[Series 24: T1 dynamic fat-sat · axial · 3.0mm · 1.19mm/px · z∈[-65,+160]mm · 3 of 76 slices shown (3 of 4)]
[im 1/76]
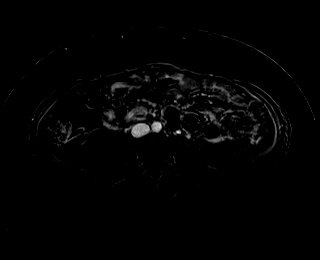
[im 38/76]
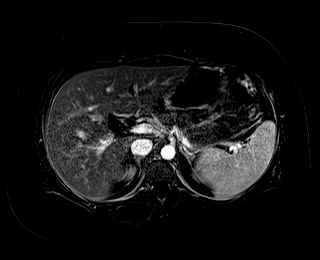
[im 76/76]
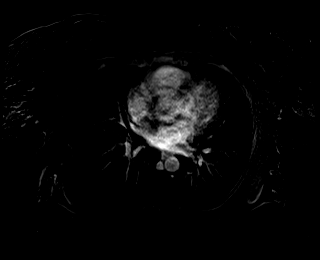

[Series 25: T1 dynamic post-contrast · coronal · 3.0mm · 1.31mm/px · 3 of 72 slices shown]
[im 1/72]
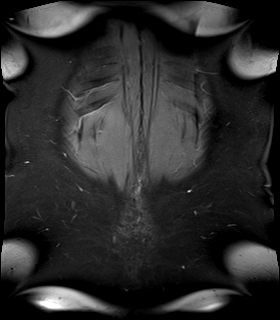
[im 36/72]
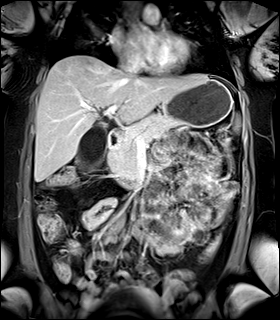
[im 72/72]
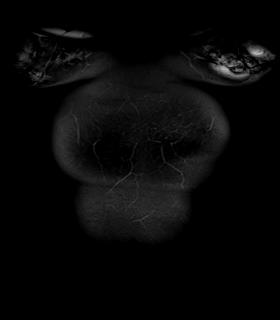

[Series 26: T1 dynamic fat-sat post-contrast · axial · 3.0mm · 1.19mm/px · z∈[-65,+160]mm · 3 of 76 slices shown (4 of 4)]
[im 1/76]
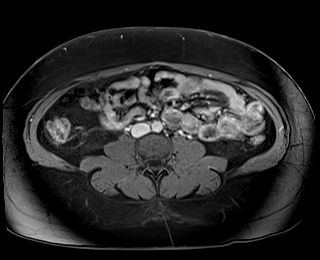
[im 38/76]
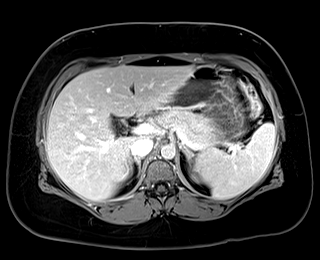
[im 76/76]
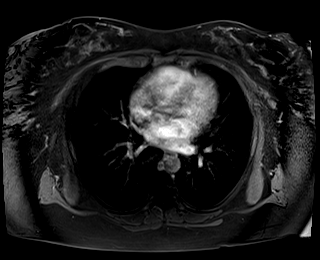

[Series 27: T1 dynamic fat-sat · axial · 3.0mm · 1.19mm/px · z∈[-65,+160]mm · 3 of 76 slices shown (4 of 4)]
[im 1/76]
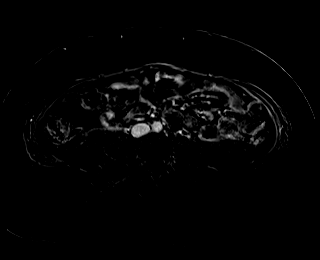
[im 38/76]
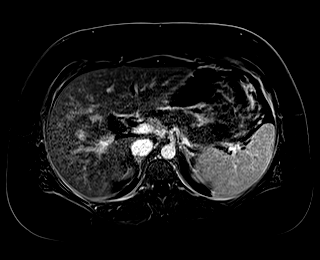
[im 76/76]
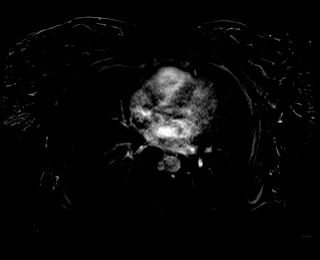

[48 of 48 positions shown; findings below may reference images not displayed]

FINDINGS: Lower chest: Unremarkable.

Hepatobiliary: Very mild loss of signal intensity throughout the
hepatic parenchyma on out of phase dual echo images, indicative of a
background of very mild hepatic steatosis. No suspicious cystic or
solid hepatic lesions. No intra or extrahepatic biliary ductal
dilatation noted on MRCP images. Common bile duct measures 6 mm in
the porta hepatis. No filling defects within the common bile duct to
suggest choledocholithiasis. Multiple tiny filling defects lying
dependently in the gallbladder, compatible with cholelithiasis.
Gallbladder wall appears minimally thickened and edematous. No frank
pericholecystic fluid or overt surrounding inflammatory changes.
Gallbladder is moderately distended.

Pancreas: No pancreatic mass. No pancreatic ductal dilatation noted
on MRCP images. No pancreatic or peripancreatic fluid collections or
inflammatory changes.

Spleen:  Unremarkable.

Adrenals/Urinary Tract: Bilateral kidneys and adrenal glands are
normal in appearance. No hydroureteronephrosis in the visualized
portions of the abdomen.

Stomach/Bowel: Visualized portions are unremarkable.

Vascular/Lymphatic: No aneurysm identified in the visualized
abdominal vasculature. No lymphadenopathy noted in the abdomen.

Other: No significant volume of ascites noted in the visualized
portions of the peritoneal cavity.

Musculoskeletal: No aggressive appearing osseous lesions are noted
in the visualized portions of the skeleton.
IMPRESSION: 1. Study is positive for cholelithiasis. Gallbladder is mildly
distended with a mildly edematous wall, but without frank
pericholecystic fluid or overt surrounding inflammatory changes to
clearly indicate an acute cholecystitis at this time. Further
clinical evaluation is recommended.
2. No choledocholithiasis or findings of biliary tract obstruction.

## 2021-10-15 SURGERY — CHOLECYSTECTOMY, ROBOT-ASSISTED, LAPAROSCOPIC
Anesthesia: General

## 2021-10-15 MED ORDER — ENOXAPARIN SODIUM 40 MG/0.4ML IJ SOSY
40.0000 mg | PREFILLED_SYRINGE | INTRAMUSCULAR | Status: DC
  Administered 2021-10-16 – 2021-10-17 (×2): 40 mg via SUBCUTANEOUS
  Filled 2021-10-15 (×2): qty 0.4

## 2021-10-15 MED ORDER — DOCUSATE SODIUM 100 MG PO CAPS: 100.0000 mg | ORAL_CAPSULE | Freq: Two times a day (BID) | ORAL | Status: DC | PRN

## 2021-10-15 MED ORDER — ONDANSETRON HCL 4 MG/2ML IJ SOLN
INTRAMUSCULAR | Status: AC
  Filled 2021-10-15: qty 2

## 2021-10-15 MED ORDER — HYDROCODONE-ACETAMINOPHEN 5-325 MG PO TABS
1.0000 | ORAL_TABLET | ORAL | Status: DC | PRN
  Administered 2021-10-15 – 2021-10-16 (×2): 2 via ORAL
  Filled 2021-10-15 (×2): qty 2

## 2021-10-15 MED ORDER — CHLORHEXIDINE GLUCONATE 0.12 % MT SOLN: 15.0000 mL | Freq: Once | OROMUCOSAL | Status: AC

## 2021-10-15 MED ORDER — OXYCODONE HCL 5 MG/5ML PO SOLN: 5.0000 mg | Freq: Once | ORAL | Status: DC | PRN

## 2021-10-15 MED ORDER — EPHEDRINE SULFATE (PRESSORS) 50 MG/ML IJ SOLN
INTRAMUSCULAR | Status: DC | PRN
  Administered 2021-10-15 (×3): 5 mg via INTRAVENOUS

## 2021-10-15 MED ORDER — SUGAMMADEX SODIUM 200 MG/2ML IV SOLN
INTRAVENOUS | Status: DC | PRN
  Administered 2021-10-15: 400 mg via INTRAVENOUS

## 2021-10-15 MED ORDER — PROPOFOL 10 MG/ML IV BOLUS
INTRAVENOUS | Status: DC | PRN
  Administered 2021-10-15: 120 mg via INTRAVENOUS

## 2021-10-15 MED ORDER — PROPOFOL 10 MG/ML IV BOLUS
INTRAVENOUS | Status: AC
  Filled 2021-10-15: qty 20

## 2021-10-15 MED ORDER — FENTANYL CITRATE (PF) 100 MCG/2ML IJ SOLN
INTRAMUSCULAR | Status: AC
  Administered 2021-10-15: 50 ug via INTRAVENOUS
  Filled 2021-10-15: qty 2

## 2021-10-15 MED ORDER — ONDANSETRON HCL 4 MG/2ML IJ SOLN: 4.0000 mg | Freq: Four times a day (QID) | INTRAMUSCULAR | Status: DC | PRN

## 2021-10-15 MED ORDER — PROMETHAZINE HCL 25 MG/ML IJ SOLN: 6.2500 mg | INTRAMUSCULAR | Status: DC | PRN

## 2021-10-15 MED ORDER — 0.9 % SODIUM CHLORIDE (POUR BTL) OPTIME
TOPICAL | Status: DC | PRN
  Administered 2021-10-15: 500 mL

## 2021-10-15 MED ORDER — LABETALOL HCL 200 MG PO TABS
300.0000 mg | ORAL_TABLET | Freq: Two times a day (BID) | ORAL | Status: DC
  Administered 2021-10-16 – 2021-10-17 (×3): 300 mg via ORAL
  Filled 2021-10-15 (×3): qty 1

## 2021-10-15 MED ORDER — LACTATED RINGERS IV BOLUS
1000.0000 mL | Freq: Once | INTRAVENOUS | Status: AC
  Administered 2021-10-15: 1000 mL via INTRAVENOUS

## 2021-10-15 MED ORDER — ACETAMINOPHEN 10 MG/ML IV SOLN: 1000.0000 mg | Freq: Once | INTRAVENOUS | Status: DC | PRN

## 2021-10-15 MED ORDER — PHENYLEPHRINE 80 MCG/ML (10ML) SYRINGE FOR IV PUSH (FOR BLOOD PRESSURE SUPPORT)
PREFILLED_SYRINGE | INTRAVENOUS | Status: DC | PRN
  Administered 2021-10-15 (×2): 80 ug via INTRAVENOUS
  Administered 2021-10-15: 160 ug via INTRAVENOUS
  Administered 2021-10-15: 80 ug via INTRAVENOUS
  Administered 2021-10-15: 160 ug via INTRAVENOUS
  Administered 2021-10-15: 80 ug via INTRAVENOUS
  Administered 2021-10-15: 160 ug via INTRAVENOUS

## 2021-10-15 MED ORDER — BUPIVACAINE HCL (PF) 0.5 % IJ SOLN
INTRAMUSCULAR | Status: AC
  Filled 2021-10-15: qty 30

## 2021-10-15 MED ORDER — MIDAZOLAM HCL 2 MG/2ML IJ SOLN
INTRAMUSCULAR | Status: AC
  Filled 2021-10-15: qty 2

## 2021-10-15 MED ORDER — OXYCODONE HCL 5 MG PO TABS: 5.0000 mg | ORAL_TABLET | Freq: Once | ORAL | Status: DC | PRN

## 2021-10-15 MED ORDER — LIDOCAINE-EPINEPHRINE (PF) 1 %-1:200000 IJ SOLN
INTRAMUSCULAR | Status: AC
  Filled 2021-10-15: qty 30

## 2021-10-15 MED ORDER — FENTANYL CITRATE (PF) 100 MCG/2ML IJ SOLN
INTRAMUSCULAR | Status: AC
  Filled 2021-10-15: qty 2

## 2021-10-15 MED ORDER — GADOBUTROL 1 MMOL/ML IV SOLN
8.0000 mL | Freq: Once | INTRAVENOUS | Status: AC | PRN
  Administered 2021-10-15: 8 mL via INTRAVENOUS

## 2021-10-15 MED ORDER — DEXAMETHASONE SODIUM PHOSPHATE 10 MG/ML IJ SOLN
INTRAMUSCULAR | Status: AC
  Filled 2021-10-15: qty 1

## 2021-10-15 MED ORDER — CHLORHEXIDINE GLUCONATE 0.12 % MT SOLN
OROMUCOSAL | Status: AC
  Administered 2021-10-15: 15 mL via OROMUCOSAL
  Filled 2021-10-15: qty 15

## 2021-10-15 MED ORDER — MIDAZOLAM HCL 2 MG/2ML IJ SOLN
INTRAMUSCULAR | Status: DC | PRN
  Administered 2021-10-15: 2 mg via INTRAVENOUS

## 2021-10-15 MED ORDER — TRAMADOL HCL 50 MG PO TABS
50.0000 mg | ORAL_TABLET | Freq: Four times a day (QID) | ORAL | Status: DC | PRN
  Administered 2021-10-15 – 2021-10-16 (×2): 50 mg via ORAL
  Filled 2021-10-15 (×2): qty 1

## 2021-10-15 MED ORDER — ONDANSETRON 4 MG PO TBDP
4.0000 mg | ORAL_TABLET | Freq: Once | ORAL | Status: AC | PRN
Start: 2021-10-15 — End: 2021-10-15
  Administered 2021-10-15: 4 mg via ORAL
  Filled 2021-10-15: qty 1

## 2021-10-15 MED ORDER — DROPERIDOL 2.5 MG/ML IJ SOLN: 0.6250 mg | Freq: Once | INTRAMUSCULAR | Status: DC | PRN

## 2021-10-15 MED ORDER — LACTATED RINGERS IV SOLN: INTRAVENOUS | Status: DC

## 2021-10-15 MED ORDER — FENTANYL CITRATE (PF) 100 MCG/2ML IJ SOLN
25.0000 ug | INTRAMUSCULAR | Status: DC | PRN
  Administered 2021-10-15: 50 ug via INTRAVENOUS

## 2021-10-15 MED ORDER — MORPHINE SULFATE (PF) 2 MG/ML IV SOLN
2.0000 mg | INTRAVENOUS | Status: DC | PRN
  Administered 2021-10-15 – 2021-10-16 (×4): 2 mg via INTRAVENOUS
  Filled 2021-10-15 (×4): qty 1

## 2021-10-15 MED ORDER — LIDOCAINE HCL (CARDIAC) PF 100 MG/5ML IV SOSY
PREFILLED_SYRINGE | INTRAVENOUS | Status: DC | PRN
  Administered 2021-10-15: 100 mg via INTRAVENOUS

## 2021-10-15 MED ORDER — MORPHINE SULFATE (PF) 4 MG/ML IV SOLN
4.0000 mg | Freq: Once | INTRAVENOUS | Status: AC
  Administered 2021-10-15: 4 mg via INTRAVENOUS
  Filled 2021-10-15: qty 1

## 2021-10-15 MED ORDER — INDOCYANINE GREEN 25 MG IV SOLR
1.2500 mg | Freq: Once | INTRAVENOUS | Status: AC
  Administered 2021-10-15: 1.25 mg via INTRAVENOUS
  Filled 2021-10-15: qty 0.5

## 2021-10-15 MED ORDER — LIDOCAINE-EPINEPHRINE (PF) 1 %-1:200000 IJ SOLN
INTRAMUSCULAR | Status: DC | PRN
  Administered 2021-10-15: 20 mL via INTRAMUSCULAR

## 2021-10-15 MED ORDER — DEXAMETHASONE SODIUM PHOSPHATE 10 MG/ML IJ SOLN
INTRAMUSCULAR | Status: DC | PRN
  Administered 2021-10-15: 10 mg via INTRAVENOUS

## 2021-10-15 MED ORDER — CEFAZOLIN SODIUM-DEXTROSE 2-4 GM/100ML-% IV SOLN
INTRAVENOUS | Status: AC
  Filled 2021-10-15: qty 100

## 2021-10-15 MED ORDER — ROCURONIUM BROMIDE 100 MG/10ML IV SOLN
INTRAVENOUS | Status: DC | PRN
  Administered 2021-10-15: 70 mg via INTRAVENOUS

## 2021-10-15 MED ORDER — ONDANSETRON HCL 4 MG/2ML IJ SOLN
INTRAMUSCULAR | Status: DC | PRN
  Administered 2021-10-15: 4 mg via INTRAVENOUS

## 2021-10-15 MED ORDER — PHENYLEPHRINE 80 MCG/ML (10ML) SYRINGE FOR IV PUSH (FOR BLOOD PRESSURE SUPPORT)
PREFILLED_SYRINGE | INTRAVENOUS | Status: AC
  Filled 2021-10-15: qty 10

## 2021-10-15 MED ORDER — EPHEDRINE 5 MG/ML INJ
INTRAVENOUS | Status: AC
  Filled 2021-10-15: qty 5

## 2021-10-15 MED ORDER — ACETAMINOPHEN 10 MG/ML IV SOLN
INTRAVENOUS | Status: DC | PRN
  Administered 2021-10-15: 1000 mg via INTRAVENOUS

## 2021-10-15 MED ORDER — SCOPOLAMINE 1 MG/3DAYS TD PT72
1.0000 | MEDICATED_PATCH | Freq: Once | TRANSDERMAL | Status: DC
  Administered 2021-10-15: 1.5 mg via TRANSDERMAL
  Filled 2021-10-15: qty 1

## 2021-10-15 MED ORDER — ROCURONIUM BROMIDE 10 MG/ML (PF) SYRINGE
PREFILLED_SYRINGE | INTRAVENOUS | Status: AC
  Filled 2021-10-15: qty 10

## 2021-10-15 MED ORDER — FENTANYL CITRATE (PF) 100 MCG/2ML IJ SOLN
INTRAMUSCULAR | Status: DC | PRN
  Administered 2021-10-15 (×2): 50 ug via INTRAVENOUS

## 2021-10-15 MED ORDER — SCOPOLAMINE 1 MG/3DAYS TD PT72
MEDICATED_PATCH | TRANSDERMAL | Status: AC
  Filled 2021-10-15: qty 1

## 2021-10-15 MED ORDER — CEFAZOLIN SODIUM-DEXTROSE 2-4 GM/100ML-% IV SOLN
2.0000 g | Freq: Once | INTRAVENOUS | Status: AC
  Administered 2021-10-15: 2 g via INTRAVENOUS

## 2021-10-15 MED ORDER — ONDANSETRON HCL 4 MG/2ML IJ SOLN
4.0000 mg | Freq: Once | INTRAMUSCULAR | Status: AC
  Administered 2021-10-15: 4 mg via INTRAVENOUS
  Filled 2021-10-15: qty 2

## 2021-10-15 MED ORDER — ONDANSETRON 4 MG PO TBDP: 4.0000 mg | ORAL_TABLET | Freq: Four times a day (QID) | ORAL | Status: DC | PRN

## 2021-10-15 SURGICAL SUPPLY — 53 items
ANCHOR TIS RET SYS 235ML (MISCELLANEOUS) ×2 IMPLANT
BAG INFUSER PRESSURE 100CC (MISCELLANEOUS) IMPLANT
BLADE SURG SZ11 CARB STEEL (BLADE) ×2 IMPLANT
CANNULA REDUC XI 12-8 STAPL (CANNULA) ×1
CANNULA REDUCER 12-8 DVNC XI (CANNULA) ×1 IMPLANT
CATH REDDICK CHOLANGI 4FR 50CM (CATHETERS) IMPLANT
CLIP LIGATING HEMO O LOK GREEN (MISCELLANEOUS) ×2 IMPLANT
DERMABOND ADVANCED (GAUZE/BANDAGES/DRESSINGS) ×1
DERMABOND ADVANCED .7 DNX12 (GAUZE/BANDAGES/DRESSINGS) ×1 IMPLANT
DRAPE ARM DVNC X/XI (DISPOSABLE) ×4 IMPLANT
DRAPE C-ARM XRAY 36X54 (DRAPES) IMPLANT
DRAPE COLUMN DVNC XI (DISPOSABLE) ×1 IMPLANT
DRAPE DA VINCI XI ARM (DISPOSABLE) ×4
DRAPE DA VINCI XI COLUMN (DISPOSABLE) ×1
ELECT CAUTERY BLADE 6.4 (BLADE) ×2 IMPLANT
ELECT REM PT RETURN 9FT ADLT (ELECTROSURGICAL) ×2
ELECTRODE REM PT RTRN 9FT ADLT (ELECTROSURGICAL) ×1 IMPLANT
GLOVE BIOGEL PI IND STRL 7.0 (GLOVE) ×2 IMPLANT
GLOVE BIOGEL PI INDICATOR 7.0 (GLOVE) ×2
GLOVE SURG SYN 6.5 ES PF (GLOVE) ×4 IMPLANT
GLOVE SURG SYN 6.5 PF PI (GLOVE) ×2 IMPLANT
GOWN STRL REUS W/ TWL LRG LVL3 (GOWN DISPOSABLE) ×3 IMPLANT
GOWN STRL REUS W/TWL LRG LVL3 (GOWN DISPOSABLE) ×3
GRASPER SUT TROCAR 14GX15 (MISCELLANEOUS) ×1 IMPLANT
IRRIGATOR SUCT 8 DISP DVNC XI (IRRIGATION / IRRIGATOR) IMPLANT
IRRIGATOR SUCTION 8MM XI DISP (IRRIGATION / IRRIGATOR)
IV NS 1000ML (IV SOLUTION)
IV NS 1000ML BAXH (IV SOLUTION) IMPLANT
LABEL OR SOLS (LABEL) ×2 IMPLANT
MANIFOLD NEPTUNE II (INSTRUMENTS) ×2 IMPLANT
NDL INSUFFLATION 14GA 120MM (NEEDLE) ×1 IMPLANT
NEEDLE HYPO 22GX1.5 SAFETY (NEEDLE) ×2 IMPLANT
NEEDLE INSUFFLATION 14GA 120MM (NEEDLE) ×2 IMPLANT
NS IRRIG 500ML POUR BTL (IV SOLUTION) ×2 IMPLANT
OBTURATOR OPTICAL STANDARD 8MM (TROCAR) ×1
OBTURATOR OPTICAL STND 8 DVNC (TROCAR) ×1
OBTURATOR OPTICALSTD 8 DVNC (TROCAR) ×1 IMPLANT
PACK LAP CHOLECYSTECTOMY (MISCELLANEOUS) ×2 IMPLANT
PENCIL ELECTRO HAND CTR (MISCELLANEOUS) ×2 IMPLANT
SEAL CANN UNIV 5-8 DVNC XI (MISCELLANEOUS) ×3 IMPLANT
SEAL XI 5MM-8MM UNIVERSAL (MISCELLANEOUS) ×3
SET TUBE SMOKE EVAC HIGH FLOW (TUBING) ×2 IMPLANT
SOLUTION ELECTROLUBE (MISCELLANEOUS) ×2 IMPLANT
SPIKE FLUID TRANSFER (MISCELLANEOUS) ×4 IMPLANT
STAPLER CANNULA SEAL DVNC XI (STAPLE) ×1 IMPLANT
STAPLER CANNULA SEAL XI (STAPLE) ×1
SUT MNCRL 4-0 (SUTURE) ×2
SUT MNCRL 4-0 27XMFL (SUTURE) ×2
SUT VICRYL 0 AB UR-6 (SUTURE) ×2 IMPLANT
SUTURE MNCRL 4-0 27XMF (SUTURE) ×2 IMPLANT
SYR 30ML LL (SYRINGE) IMPLANT
SYSTEM WECK SHIELD CLOSURE (TROCAR) IMPLANT
WATER STERILE IRR 500ML POUR (IV SOLUTION) ×2 IMPLANT

## 2021-10-15 NOTE — ED Notes (Signed)
Patient completing MRI screening at this time. 

## 2021-10-15 NOTE — Transfer of Care (Signed)
Immediate Anesthesia Transfer of Care Note  Patient: Leslie Powell  Procedure(s) Performed: XI ROBOTIC ASSISTED LAPAROSCOPIC CHOLECYSTECTOMY INDOCYANINE GREEN FLUORESCENCE IMAGING (ICG)  Patient Location: PACU  Anesthesia Type:General  Level of Consciousness: drowsy  Airway & Oxygen Therapy: Patient Spontanous Breathing  Post-op Assessment: Report given to RN and Post -op Vital signs reviewed and stable  Post vital signs: Reviewed and stable  Last Vitals:  Vitals Value Taken Time  BP 119/84 10/15/21 1626  Temp 36.2 C 10/15/21 1626  Pulse 81 10/15/21 1628  Resp 18 10/15/21 1628  SpO2 100 % 10/15/21 1628  Vitals shown include unvalidated device data.  Last Pain:  Vitals:   10/15/21 1406  TempSrc: Oral  PainSc: 0-No pain         Complications: No notable events documented.

## 2021-10-15 NOTE — Anesthesia Preprocedure Evaluation (Addendum)
Anesthesia Evaluation  Patient identified by MRN, date of birth, ID band Patient awake    Reviewed: Allergy & Precautions, NPO status , Patient's Chart, lab work & pertinent test results  Airway Mallampati: III  TM Distance: >3 FB Neck ROM: full    Dental no notable dental hx.    Pulmonary neg pulmonary ROS,    Pulmonary exam normal        Cardiovascular negative cardio ROS Normal cardiovascular exam     Neuro/Psych negative neurological ROS  negative psych ROS   GI/Hepatic Neg liver ROS, Acute cholecystits   Endo/Other  negative endocrine ROS  Renal/GU      Musculoskeletal   Abdominal (+) + obese,  Abdomen: soft.    Peds  Hematology negative hematology ROS (+)   Anesthesia Other Findings Past Medical History: 02/2019: Spontaneous abortion  Past Surgical History: No date: Denies surgical history  BMI    Body Mass Index: 38.08 kg/m      Reproductive/Obstetrics negative OB ROS                            Anesthesia Physical Anesthesia Plan  ASA: 2  Anesthesia Plan: General ETT   Post-op Pain Management: Toradol IV (intra-op)* and Ofirmev IV (intra-op)*   Induction: Intravenous  PONV Risk Score and Plan: 4 or greater and Ondansetron, Dexamethasone, Midazolam and Scopolamine patch - Pre-op  Airway Management Planned: Oral ETT  Additional Equipment:   Intra-op Plan:   Post-operative Plan: Extubation in OR  Informed Consent: I have reviewed the patients History and Physical, chart, labs and discussed the procedure including the risks, benefits and alternatives for the proposed anesthesia with the patient or authorized representative who has indicated his/her understanding and acceptance.     Dental Advisory Given  Plan Discussed with: Anesthesiologist, CRNA and Surgeon  Anesthesia Plan Comments:        Anesthesia Quick Evaluation

## 2021-10-15 NOTE — ED Provider Notes (Signed)
[facility] Provider Note    Event Date/Time   First MD Initiated Contact with Patient 10/15/21 0327     (approximate)   History   Chief Complaint Abdominal Pain and Back Pain   HPI  Leslie Powell Leslie Powell is a 26 y.o. female with no significant past medical history presents to the ED complaining of abdominal pain.  Patient reports that she has been dealing with increasing pain in her epigastrium and right upper quadrant since about 10:00 last night.  She describes the pain as constant and sharp, radiating towards her right mid back.  She has not had any fevers, endorses nausea but has not had any vomiting or diarrhea.  She denies any dysuria, hematuria, or flank pain.  She had similar symptoms a couple of weeks ago, when she was about [redacted] weeks pregnant and found to have cholecystitis.  She was visiting [place] at that time and required antibiotics as well as induction of labor.  Following delivery, she dealt with gestational hypertension and pulmonary edema requiring further observation in the hospital.  She returned home about 2 weeks ago and has not been on antibiotics since then, denies any recurrent abdominal pain until this evening.     Physical Exam   Triage Vital Signs: ED Triage Vitals  Enc Vitals Group     BP 10/15/21 0015 125/66     Pulse Rate 10/15/21 0015 69     Resp 10/15/21 0015 18     Temp 10/15/21 0015 97.7 F (36.5 C)     Temp Source 10/15/21 0015 Oral     SpO2 10/15/21 0015 95 %     Weight 10/15/21 0017 195 lb (88.5 kg)     Height 10/15/21 0017 5' (1.524 m)     Head Circumference --      Peak Flow --      Pain Score 10/15/21 0017 8     Pain Loc --      Pain Edu? --      Excl. in GC? --     Most recent vital signs: Vitals:   10/15/21 0015 10/15/21 0613  BP: 125/66 (!) 138/94  Pulse: 69 91  Resp: 18 18  Temp: 97.7 F (36.5 C)   SpO2: 95% 98%    Constitutional: Alert and oriented. Eyes: Conjunctivae are  normal. Head: Atraumatic. Nose: No congestion/rhinnorhea. Mouth/Throat: Mucous membranes are moist.  Cardiovascular: Normal rate, regular rhythm. Grossly normal heart sounds.  2+ radial pulses bilaterally. Respiratory: Normal respiratory effort.  No retractions. Lungs CTAB. Gastrointestinal: Soft and tender to palpation in the epigastrium and right upper quadrant with voluntary guarding. No distention. Musculoskeletal: No lower extremity tenderness nor edema.  Neurologic:  Normal speech and language. No gross focal neurologic deficits are appreciated.    ED Results / Procedures / Treatments   Labs (all labs ordered are listed, but only abnormal results are displayed) Labs Reviewed  COMPREHENSIVE METABOLIC PANEL - Abnormal; Notable for the following components:      Result Value   Glucose, Bld 110 (*)    All other components within normal limits  CBC - Abnormal; Notable for the following components:   WBC 12.9 (*)    Hemoglobin 11.8 (*)    MCH 25.8 (*)    All other components within normal limits  URINALYSIS, ROUTINE W REFLEX MICROSCOPIC - Abnormal; Notable for the following components:   Color, Urine YELLOW (*)    APPearance CLEAR (*)    All other  components within normal limits  LIPASE, BLOOD  POC URINE PREG, ED   RADIOLOGY Right upper quadrant ultrasound reviewed and interpreted by me with gallstones noted but no pericholecystic fluid or wall thickening to suggest cholecystitis.  PROCEDURES:  Critical Care performed: No  Procedures   MEDICATIONS ORDERED IN ED: Medications  ondansetron (ZOFRAN-ODT) disintegrating tablet 4 mg (4 mg Oral Given 10/15/21 0021)  morphine (PF) 4 MG/ML injection 4 mg (4 mg Intravenous Given 10/15/21 0415)  ondansetron (ZOFRAN) injection 4 mg (4 mg Intravenous Given 10/15/21 0414)  lactated ringers bolus 1,000 mL (1,000 mLs Intravenous New Bag/Given 10/15/21 0416)     IMPRESSION / MDM / ASSESSMENT AND PLAN / ED COURSE  I reviewed the triage  vital signs and the nursing notes.                              26 y.o. female presents to the ED complaining of constant pain in her epigastrium and right upper quadrant radiating towards her back since 10 PM last night, similar to recent admission for cholecystitis in the setting of pregnancy.  Patient's presentation is most consistent with acute complicated illness / injury requiring diagnostic workup.  Differential diagnosis includes, but is not limited to, cholecystitis, biliary colic, pancreatitis, hepatitis, gastritis, gastroenteritis, UTI.  Patient uncomfortable appearing but in no acute distress, vital signs are reassuring with no elevation in blood pressure to suggest preeclampsia.  She does have focal tenderness in her epigastrium and right upper quadrant, concerning for recurrent cholecystitis.  We will further assess with right upper quadrant ultrasound, treat symptomatically with IV morphine and Zofran.  Labs are reassuring, CBC shows mild leukocytosis with chronic anemia, BMP without electrolyte abnormality or AKI, LFTs and lipase are within normal limits.  Pregnancy testing is negative and urinalysis shows no signs of infection.  Right upper quadrant ultrasound shows gallstones with no evidence of cholecystitis, patient does have dilated CBD concerning for choledocholithiasis.  Plan to further assess with MRCP, patient turned over to oncoming provider pending results and potential discussion with GI.      FINAL CLINICAL IMPRESSION(S) / ED DIAGNOSES   Final diagnoses:  Gallstones     Rx / DC Orders   ED Discharge Orders     None        Note:  This document was prepared using Dragon voice recognition software and may include unintentional dictation errors.   Chesley Noon, MD 10/15/21 [phone]

## 2021-10-15 NOTE — Anesthesia Procedure Notes (Signed)
Procedure Name: Intubation Date/Time: 10/15/2021 2:59 PM Performed by: Tollie Eth, CRNA Pre-anesthesia Checklist: Patient identified, Patient being monitored, Timeout performed, Emergency Drugs available and Suction available Patient Re-evaluated:Patient Re-evaluated prior to induction Oxygen Delivery Method: Circle system utilized Preoxygenation: Pre-oxygenation with 100% oxygen Induction Type: IV induction Ventilation: Mask ventilation without difficulty Laryngoscope Size: 3 and McGraph Grade View: Grade I Tube type: Oral Tube size: 6.5 mm Number of attempts: 1 Airway Equipment and Method: Stylet Placement Confirmation: ETT inserted through vocal cords under direct vision, positive ETCO2 and breath sounds checked- equal and bilateral Secured at: 20 cm Tube secured with: Tape Dental Injury: Teeth and Oropharynx as per pre-operative assessment

## 2021-10-15 NOTE — Anesthesia Postprocedure Evaluation (Signed)
Anesthesia Post Note  Patient: Breshay De Jesus Turnbough  Procedure(s) Performed: XI ROBOTIC ASSISTED LAPAROSCOPIC CHOLECYSTECTOMY INDOCYANINE GREEN FLUORESCENCE IMAGING (ICG)  Patient location during evaluation: PACU Anesthesia Type: General Level of consciousness: awake and alert Pain management: pain level controlled Vital Signs Assessment: post-procedure vital signs reviewed and stable Respiratory status: spontaneous breathing, nonlabored ventilation, respiratory function stable and patient connected to nasal cannula oxygen Cardiovascular status: blood pressure returned to baseline and stable Postop Assessment: no apparent nausea or vomiting Anesthetic complications: no   No notable events documented.   Last Vitals:  Vitals:   10/15/21 1630 10/15/21 1645  BP: 127/84 129/81  Pulse: 71 68  Resp: 16 19  Temp:    SpO2: 100% 99%    Last Pain:  Vitals:   10/15/21 1645  TempSrc:   PainSc: Asleep                 Corinda Gubler

## 2021-10-15 NOTE — Op Note (Addendum)
Preoperative diagnosis:  acute and cholecystitis  Postoperative diagnosis: same as above  Procedure: Robotic assisted Laparoscopic Cholecystectomy.   Anesthesia: GETA   Surgeon: Sung Amabile  Specimen: Gallbladder  Complications: None  EBL: 81mL  Wound Classification: Clean Contaminated  Indications: see HPI  Findings: Critical view of safety noted Cystic duct and artery identified, ligated and divided, clips remained intact at end of procedure Adequate hemostasis  Description of procedure:  The patient was placed on the operating table in the supine position. SCDs placed, pre-op abx administered.  General anesthesia was induced and OG tube placed by anesthesia. A time-out was completed verifying correct patient, procedure, site, positioning, and implant(s) and/or special equipment prior to beginning this procedure. The abdomen was prepped and draped in the usual sterile fashion.    Veress needle was placed at the Palmer's point and insufflation was started after confirming a positive saline drop test and no immediate increase in abdominal pressure.  After reaching 15 mm, the Veress needle was removed and a 8 mm port was placed via optiview technique under umbilicus measured 62mm from gallbladder.  The abdomen was inspected and no abnormalities or injuries were found.  Under direct vision, ports were placed in the following locations: One 12 mm patient left of the umbilicus, 8cm from the optiviewed port, one 8 mm port placed to the patient right of the umbilical port 8 cm apart.  1 additional 8 mm port placed lateral to the 37mm port.  Once ports were placed, The table was placed in the reverse Trendelenburg position with the right side up. The Xi platform was brought into the operative field and docked to the ports successfully.  An endoscope was placed through the umbilical port, fenestrated grasper through the adjacent patient right port, prograsp to the far patient left port, and then  a hook cautery in the left port.  The dome of the gallbladder was tense.  Needle used to decompress.  Wall then grasped with prograsp, passed and retracted over the dome of the liver. Adhesions between the gallbladder and omentum, duodenum and transverse colon were lysed via hook cautery. The infundibulum was grasped with the fenestrated grasper and retracted toward the right lower quadrant. This maneuver exposed Calot's triangle. The peritoneum overlying the gallbladder infundibulum was then dissected  and the cystic duct and cystic artery identified.  Critical view of safety with the liver bed clearly visible behind the duct and artery with no additional structures noted.  The cystic duct and cystic artery clipped and divided close to the gallbladder, one clip each due to extremely short cyst duct and artery adhered to each other from the inflammation.   The gallbladder was then dissected from its peritoneal and liver bed attachments by electrocautery. Bile drainage and blood was suctioned out. Hemostasis was checked prior to removing the hook cautery and the Endo Catch bag was then placed through the 12 mm port and the gallbladder was removed.  The gallbladder was passed off the table as a specimen. There was no evidence of bleeding from the gallbladder fossa or cystic artery or leakage of the bile from the cystic duct stump. The 12 mm port site closed with PMI using 0 vicryl under direct vision.  Abdomen desufflated and secondary trocars were removed under direct vision. No bleeding was noted. All skin incisions then closed with subcuticular sutures of 4-0 monocryl and dressed with topical skin adhesive. The orogastric tube was removed and patient extubated.  The patient tolerated the procedure well and  was taken to the postanesthesia care unit in stable condition.  All sponge and instrument count correct at end of procedure.

## 2021-10-15 NOTE — ED Provider Notes (Signed)
MRCP is consistent with cholecystitis, surgery consulted   Lavonia Drafts, MD 10/15/21 [phone]

## 2021-10-15 NOTE — H&P (Signed)
Subjective:   CC: acute cholecystitis  HPI:  Leslie Powell is a 26 y.o. female who is consulted by Cyril Loosen for evaluation of above cc.  Symptoms were first noted 2 days ago. Pain is sharp, RUQ.  Associated with nothing, exacerbated by nothing.  Similar pain noted couple weeks ago while pregnant. Inducing labor due to gestational complications, so recommended abx therapy for presumed acute cholecystitis at that time.  Pt states pain is identical to episode couple weeks ago, but is now under better control with the IV pain meds.     Past Medical History:  has a past medical history of Spontaneous abortion (02/2019).  Past Surgical History:  has a past surgical history that includes Denies surgical history.  Family History: family history includes Arthritis in her paternal grandmother; Asthma in her brother; Diabetes in her maternal grandfather.  Social History:  reports that she has never smoked. She has never been exposed to tobacco smoke. She has never used smokeless tobacco. She reports that she does not currently use alcohol after a past usage of about 1.0 standard drink per week. She reports that she does not currently use drugs after having used the following drugs: Marijuana.  Current Medications:  Prior to Admission medications   Medication Sig Start Date End Date Taking? Authorizing Provider  ibuprofen (ADVIL) 600 MG tablet Take 600 mg by mouth every 6 (six) hours. 10/01/21  Yes [provider]  labetalol (NORMODYNE) 300 MG tablet Take 1 tablet by mouth in the morning and at bedtime. 10/01/21  Yes [provider]  Pediatric Multiple Vitamins (FLINTSTONES MULTIVITAMIN) CHEW Chew 2 tablets by mouth daily at 6 (six) AM.    [provider]    Allergies:  Allergies as of 10/15/2021   (No Known Allergies)    ROS:  General: Denies weight loss, weight gain, fatigue, fevers, chills, and night sweats. Eyes: Denies blurry vision, double vision, eye  pain, itchy eyes, and tearing. Ears: Denies hearing loss, earache, and ringing in ears. Nose: Denies sinus pain, congestion, infections, runny nose, and nosebleeds. Mouth/throat: Denies hoarseness, sore throat, bleeding gums, and difficulty swallowing. Heart: Denies chest pain, palpitations, racing heart, irregular heartbeat, leg pain or swelling, and decreased activity tolerance. Respiratory: Denies breathing difficulty, shortness of breath, wheezing, cough, and sputum. GI: Denies change in appetite, heartburn, nausea, vomiting, constipation, diarrhea, and blood in stool. GU: Denies difficulty urinating, pain with urinating, urgency, frequency, blood in urine. Musculoskeletal: Denies joint stiffness, pain, swelling, muscle weakness. Skin: Denies rash, itching, mass, tumors, sores, and boils Neurologic: Denies headache, fainting, dizziness, seizures, numbness, and tingling. Psychiatric: Denies depression, anxiety, difficulty sleeping, and memory loss. Endocrine: Denies heat or cold intolerance, and increased thirst or urination. Blood/lymph: Denies easy bruising, and swollen glands     Objective:     BP 115/83   Pulse 84   Temp 97.7 F (36.5 C) (Oral)   Resp 18   Ht 5' (1.524 m)   Wt 88.5 kg   LMP 01/25/2021   SpO2 96%   Breastfeeding Yes   BMI 38.08 kg/m    Constitutional :  alert, cooperative, appears stated age, and no distress  Lymphatics/Throat:  no asymmetry, masses, or scars  Respiratory:  clear to auscultation bilaterally  Cardiovascular:  regular rate and rhythm  Gastrointestinal: Soft, no guarding, TTP RUQ/epigastric area .   Musculoskeletal: Steady movement  Skin: Cool and moist, no surgical scars  Psychiatric: Normal affect, non-agitated, not confused       LABS:  Latest Ref Rng & Units 10/15/2021   12:19 AM 04/28/2021   10:45 AM 04/18/2021    7:45 AM  CMP  Glucose 70 - 99 mg/dL 893   810   175    BUN 6 - 20 mg/dL 13   5   9     Creatinine 0.44 - 1.00  mg/dL   1.02   5.85    Sodium 135 - 145 mmol/L 141   135   134    Potassium 3.5 - 5.1 mmol/L 3.9   3.9   3.4    Chloride 98 - 111 mmol/L 108   101   104    CO2 22 - 32 mmol/L 26   21   20     Calcium 8.9 - 10.3 mg/dL 9.4   9.1   9.1    Total Protein 6.5 - 8.1 g/dL 7.1   6.5   6.9    Total Bilirubin 0.3 - 1.2 mg/dL 0.8   0.2   0.6    Alkaline Phos 38 - 126 U/L 84   51   45    AST 15 - 41 U/L 26   13   28     ALT 0 - 44 U/L 23   16   23         Latest Ref Rng & Units 10/15/2021   12:19 AM 04/28/2021   10:45 AM 04/18/2021    7:45 AM  CBC  WBC 4.0 - 10.5 K/uL 12.9   13.0   17.5    Hemoglobin 12.0 - 15.0 g/dL   12/15/2021   04/30/2021    Hematocrit 36.0 - 46.0 % 37.9   38.5   37.5    Platelets 150 - 400 K/uL 380   350   316       RADS: CLINICAL DATA:  26 year old female with history of lower abdominal pain with radiation into the back, with some associated nausea. No emesis.   EXAM: MRI ABDOMEN WITHOUT AND WITH CONTRAST (INCLUDING MRCP)   TECHNIQUE: Multiplanar multisequence MR imaging of the abdomen was performed both before and after the administration of intravenous contrast. Heavily T2-weighted images of the biliary and pancreatic ducts were obtained, and three-dimensional MRCP images were rendered by post processing.   CONTRAST:  82mL GADAVIST GADOBUTROL 1 MMOL/ML IV SOLN   COMPARISON:  No prior abdominal MRI. Abdominal ultrasound 10/15/2021.   FINDINGS: Lower chest: Unremarkable.   Hepatobiliary: Very mild loss of signal intensity throughout the hepatic parenchyma on out of phase dual echo images, indicative of a background of very mild hepatic steatosis. No suspicious cystic or solid hepatic lesions. No intra or extrahepatic biliary ductal dilatation noted on MRCP images. Common bile duct measures 6 mm in the porta hepatis. No filling defects within the common bile duct to suggest choledocholithiasis. Multiple tiny filling defects lying dependently in the gallbladder,  compatible with cholelithiasis. Gallbladder wall appears minimally thickened and edematous. No frank pericholecystic fluid or overt surrounding inflammatory changes. Gallbladder is moderately distended.   Pancreas: No pancreatic mass. No pancreatic ductal dilatation noted on MRCP images. No pancreatic or peripancreatic fluid collections or inflammatory changes.   Spleen:  Unremarkable.   Adrenals/Urinary Tract: Bilateral kidneys and adrenal glands are normal in appearance. No hydroureteronephrosis in the visualized portions of the abdomen.   Stomach/Bowel: Visualized portions are unremarkable.   Vascular/Lymphatic: No aneurysm identified in the visualized abdominal vasculature. No lymphadenopathy noted in the abdomen.   Other: No significant volume of ascites  noted in the visualized portions of the peritoneal cavity.   Musculoskeletal: No aggressive appearing osseous lesions are noted in the visualized portions of the skeleton.   IMPRESSION: 1. Study is positive for cholelithiasis. Gallbladder is mildly distended with a mildly edematous wall, but without frank pericholecystic fluid or overt surrounding inflammatory changes to clearly indicate an acute cholecystitis at this time. Further clinical evaluation is recommended. 2. No choledocholithiasis or findings of biliary tract obstruction.     Electronically Signed   By: Trudie Reedaniel  Entrikin M.D.   On: 10/15/2021 11:01 Assessment:      Acute cholecystitis based on consistent hx, wbc, and MRCP findings above.  Plan:     Discussed the risk of surgery including post-op infxn, seroma, biloma, chronic pain, poor-delayed wound healing, retained gallstone, conversion to open procedure, post-op SBO or ileus, and need for additional procedures to address said risks.  The risks of general anesthetic including MI, CVA, sudden death or even reaction to anesthetic medications also discussed. Alternatives include continued observation.   Benefits include possible symptom relief, prevention of complications including acute cholecystitis, pancreatitis.  Typical post operative recovery of 3-5 days rest, continued pain in area and incision sites, possible loose stools up to 4-6 weeks, also discussed.  The patient understands the risks, any and all questions were answered to the patient's satisfaction.  To OR for robo lap chole  labs/images/medications/previous chart entries reviewed personally and relevant changes/updates noted above.

## 2021-10-15 NOTE — ED Notes (Signed)
Patient resting comfortably in stretcher. Patient denies any pain at this time. NAD noted. Call light within reach, bed locked and lowered.

## 2021-10-15 NOTE — ED Notes (Signed)
Report given to same day surgery 

## 2021-10-15 NOTE — ED Notes (Signed)
Patient to MRI at this time.

## 2021-10-15 NOTE — ED Triage Notes (Signed)
Pt presents via POV with complaints of lower abdominal pain that radiates towards her back starting 2 hours ago. She endorses associated nausea without emesis. Denies CP, SOB, or urinary sx.

## 2021-10-16 ENCOUNTER — Observation Stay: Payer: Self-pay

## 2021-10-16 ENCOUNTER — Encounter: Payer: Self-pay | Admitting: Surgery

## 2021-10-16 LAB — HEPATIC FUNCTION PANEL
ALT: 128 U/L — ABNORMAL HIGH (ref 0–44)
AST: 131 U/L — ABNORMAL HIGH (ref 15–41)
Albumin: 3.4 g/dL — ABNORMAL LOW (ref 3.5–5.0)
Alkaline Phosphatase: 128 U/L — ABNORMAL HIGH (ref 38–126)
Bilirubin, Direct: 1.5 mg/dL — ABNORMAL HIGH (ref 0.0–0.2)
Indirect Bilirubin: 1.1 mg/dL — ABNORMAL HIGH (ref 0.3–0.9)
Total Bilirubin: 2.6 mg/dL — ABNORMAL HIGH (ref 0.3–1.2)
Total Protein: 7.2 g/dL (ref 6.5–8.1)

## 2021-10-16 LAB — HIV ANTIBODY (ROUTINE TESTING W REFLEX): HIV Screen 4th Generation wRfx: NONREACTIVE

## 2021-10-16 LAB — CBC
HCT: 39.3 % (ref 36.0–46.0)
Hemoglobin: 12.2 g/dL (ref 12.0–15.0)
MCH: 25.7 pg — ABNORMAL LOW (ref 26.0–34.0)
MCHC: 31 g/dL (ref 30.0–36.0)
MCV: 82.7 fL (ref 80.0–100.0)
Platelets: 400 10*3/uL (ref 150–400)
RBC: 4.75 MIL/uL (ref 3.87–5.11)
RDW: 15.7 % — ABNORMAL HIGH (ref 11.5–15.5)
WBC: 12.2 10*3/uL — ABNORMAL HIGH (ref 4.0–10.5)
nRBC: 0 % (ref 0.0–0.2)

## 2021-10-16 IMAGING — CT CT ABDOMEN W/ CM
2 of 5 series · 15 of 46 positions shown, 17 images · IV contrast (APPLIED)
Comparison: Nuclear medicine hepatobiliary study of earlier today.
Abdominal MRI of yesterday

CLINICAL DATA: Status post robot assisted laparoscopic
cholecystectomy. Increased pain and elevated liver function tests.

EXAM:
CT ABDOMEN WITH CONTRAST
TECHNIQUE: Multidetector CT imaging of the abdomen was performed using the
standard protocol following bolus administration of intravenous
contrast.

[Series 2: axial st · axial · 0.77mm/px · z∈[-1093,-838]mm · 12 of 59 slices shown, 14 images]
[im 4/59  soft-tissue]
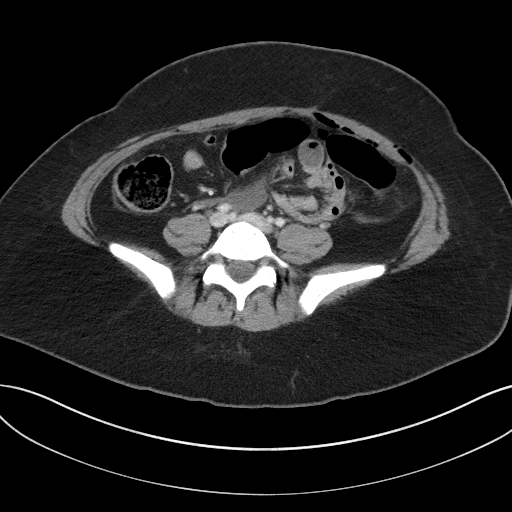
[im 4/59  bone]
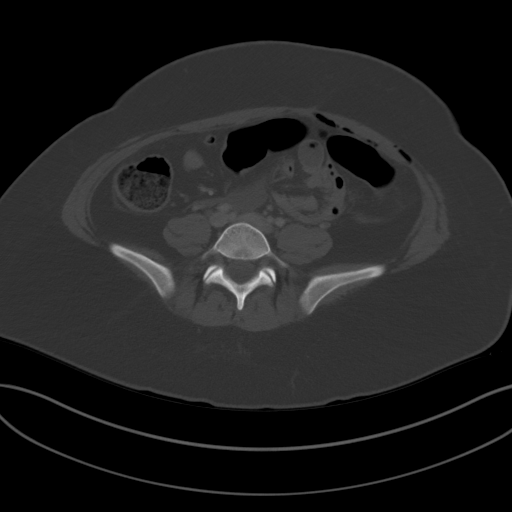
[im 8/59  soft-tissue]
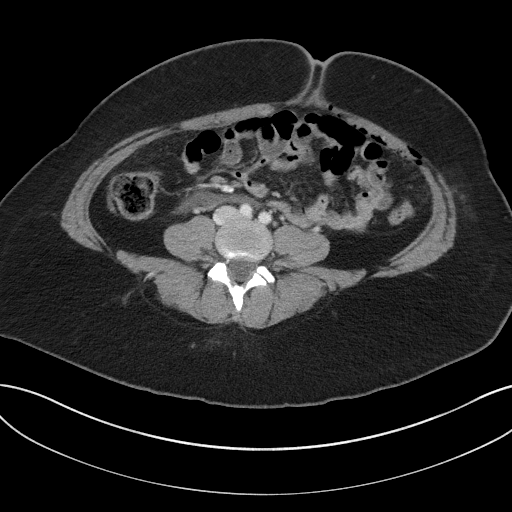
[im 12/59  soft-tissue]
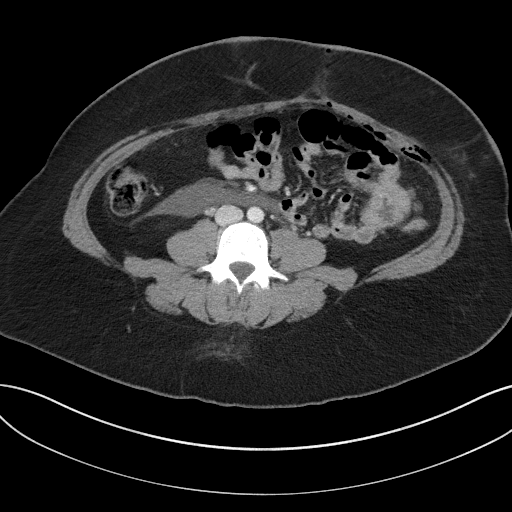
[im 20/59  soft-tissue]
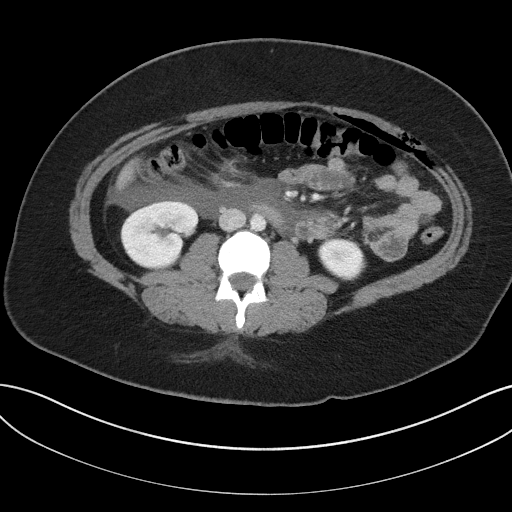
[im 24/59  soft-tissue]
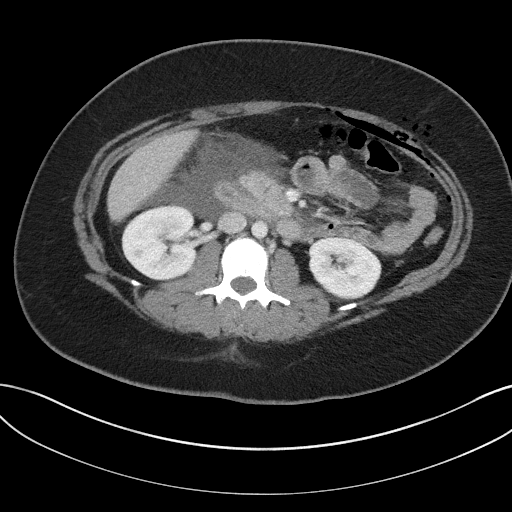
[im 28/59  soft-tissue]
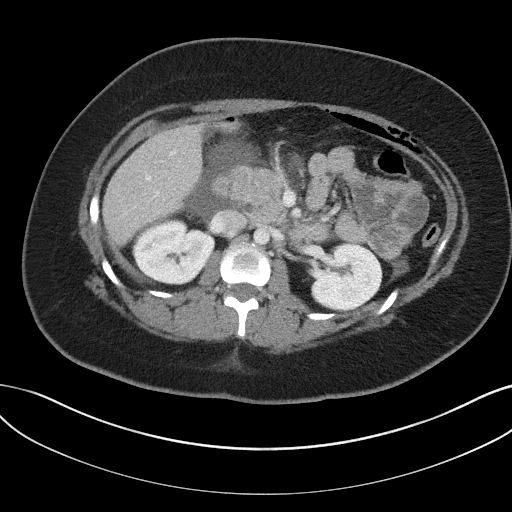
[im 31/59  soft-tissue]
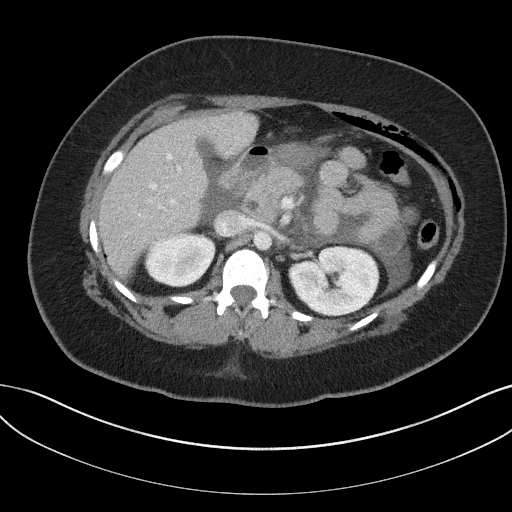
[im 35/59  soft-tissue]
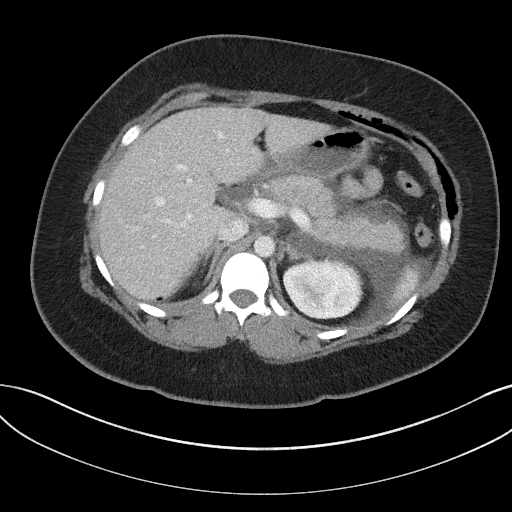
[im 39/59  soft-tissue]
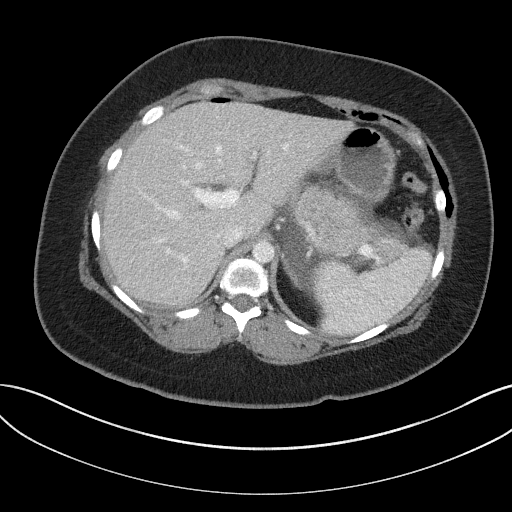
[im 39/59  bone]
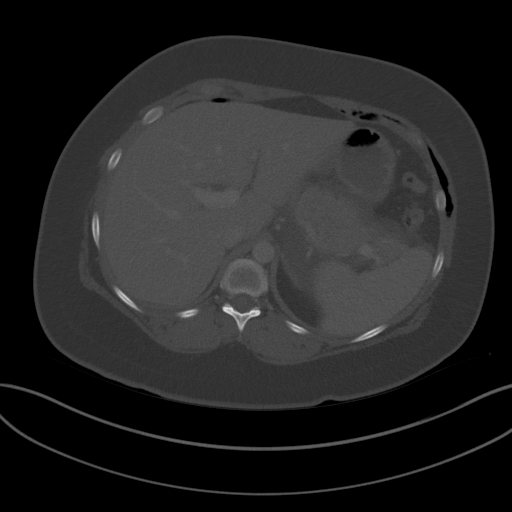
[im 47/59  soft-tissue]
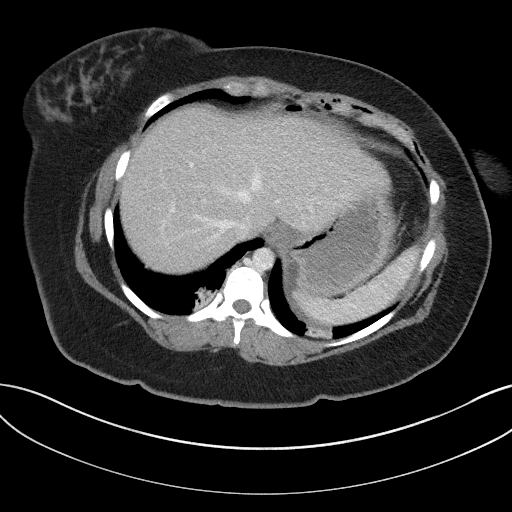
[im 51/59  soft-tissue]
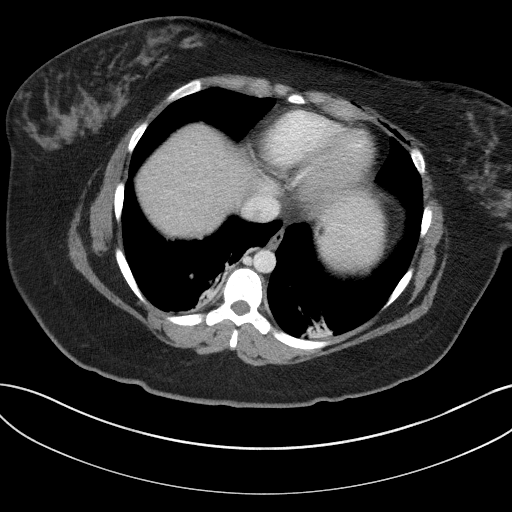
[im 55/59  soft-tissue]
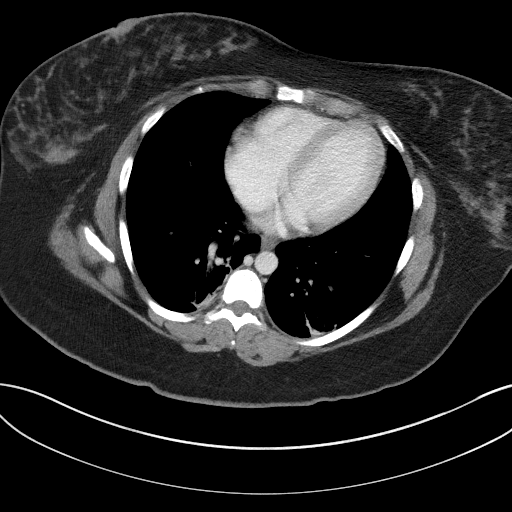

[Series 5: coronal st · coronal · 0.60mm/px · 3 of 92 slices shown]
[im 31/92  soft-tissue]
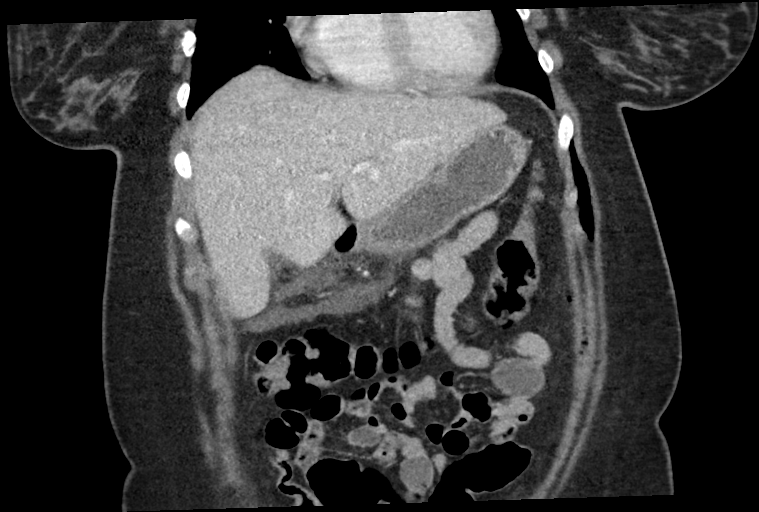
[im 41/92  soft-tissue]
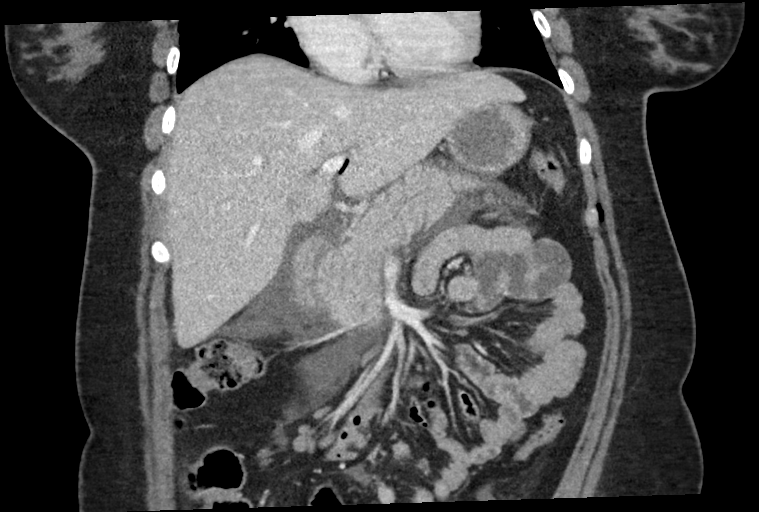
[im 51/92  soft-tissue]
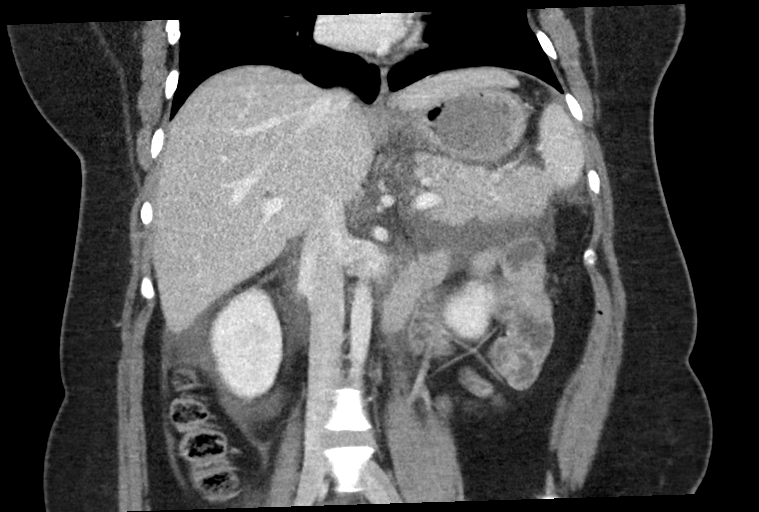

[15 of 46 positions shown; findings below may reference images not displayed]

RADIATION DOSE REDUCTION: This exam was performed according to the
departmental dose-optimization program which includes automated
exposure control, adjustment of the mA and/or kV according to
patient size and/or use of iterative reconstruction technique.

CONTRAST:  100mL OMNIPAQUE IOHEXOL 300 MG/ML  SOLN
FINDINGS: Lower chest: Dependent bibasilar atelectasis. Normal heart size
without pericardial or pleural effusion.

Hepatobiliary: Normal liver. Cholecystectomy. Intrahepatic ducts are
upper normal after cholecystectomy. The common duct measures 9 mm in
the porta hepatis on coronal image 44, normal after cholecystectomy.
Possible increased density within the common duct on [DATE].

Pancreas: No pancreatic duct dilatation. A moderate to marked volume
of fluid is seen within the peripancreatic and anterior pararenal
space. No evidence of pancreatic necrosis. No well-defined
pseudocyst.

Spleen: Normal in size, without focal abnormality.

Adrenals/Urinary Tract: Normal adrenal glands. Normal kidneys,
without hydronephrosis.

Stomach/Bowel: Normal stomach, without wall thickening. Normal
abdominal bowel loops.

Vascular/Lymphatic: Normal caliber of the aorta and branch vessels.
No retroperitoneal or retrocrural adenopathy.

Other: Extensive intra and extraperitoneal postoperative abdominal
extraluminal air.

Musculoskeletal: No acute osseous abnormality.
IMPRESSION: 1. Moderate to large amount of peripancreatic fluid, most consistent
with pancreatitis.
2. Cholecystectomy without biliary duct dilatation. Cannot exclude
choledocholithiasis. If bilirubin is elevated, consider MRCP.

## 2021-10-16 IMAGING — MR MR ABDOMEN WO/W CM MRCP
19 of 20 series · 46 of 48 positions shown · IV contrast (gadavist)
Comparison: CT of earlier today.  Abdominal MRI of yesterday

CLINICAL DATA: Status post robot assisted cholecystectomy 1 day ago
with CT demonstrating pancreatitis. Evaluate for common duct stone.

EXAM:
MRI ABDOMEN WITHOUT AND WITH CONTRAST (INCLUDING MRCP)
TECHNIQUE: Multiplanar multisequence MR imaging of the abdomen was performed
both before and after the administration of intravenous contrast.
Heavily T2-weighted images of the biliary and pancreatic ducts were
obtained, and three-dimensional MRCP images were rendered by post
processing.
CONTRAST:  7.5mL GADAVIST GADOBUTROL 1 MMOL/ML IV SOLN

[Series 3: T2 · coronal · 6.0mm · 1.19mm/px · 1 of 30 slices shown (1 of 2)]
[im 1/30]
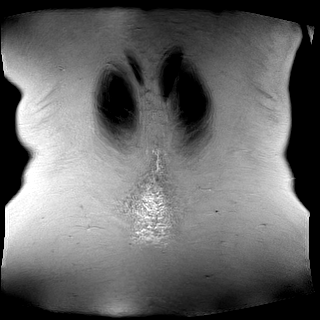

[Series 4: T2 · axial · 6.0mm · 1.19mm/px · 1 of 32 slices shown (2 of 2)]
[im 1/32]
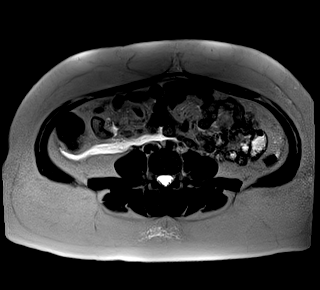

[Series 5: T1 · axial · 3.0mm · 1.19mm/px · z∈[-59,+178]mm · 3 of 80 slices shown (1 of 2)]
[im 1/80]
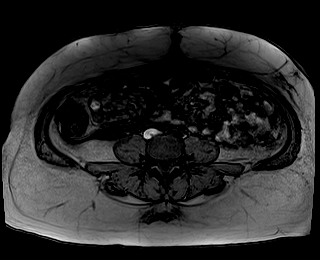
[im 40/80]
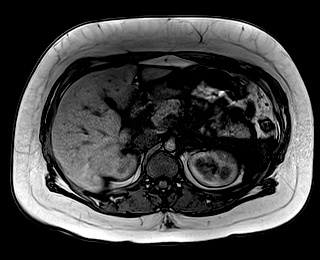
[im 80/80]
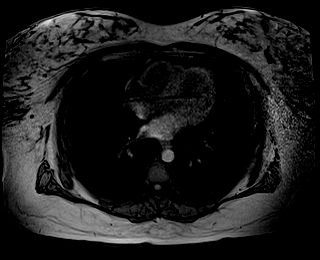

[Series 6: T1 · axial · 3.0mm · 1.19mm/px · z∈[-59,+178]mm · 3 of 80 slices shown (2 of 2)]
[im 1/80]
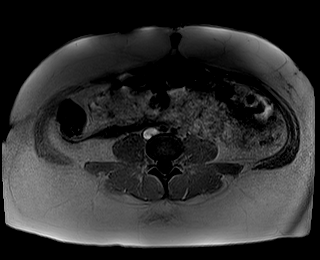
[im 40/80]
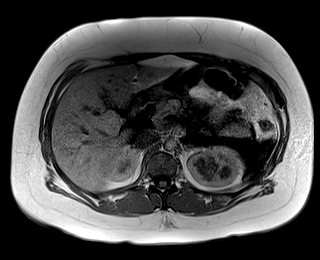
[im 80/80]
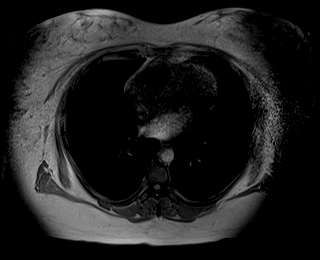

[Series 9: T2 fat-sat · axial · 6.0mm · 1.19mm/px · 1 of 34 slices shown]
[im 1/34]
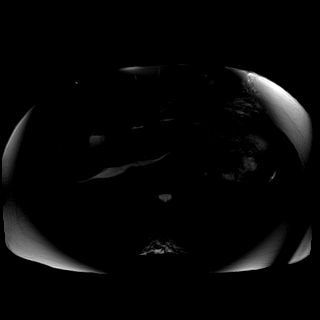

[Series 10: ax dwi_tracew · axial · 6.0mm · 1.42mm/px · z∈[-35,+203]mm · 4 of 102 slices shown]
[im 1/102]
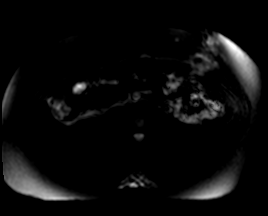
[im 34/102]
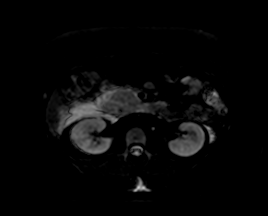
[im 68/102]
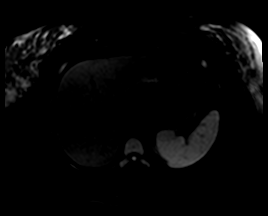
[im 102/102]
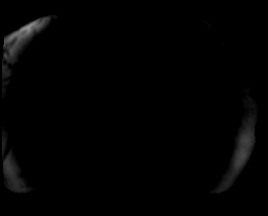

[Series 11: ax dwi_adc · axial · 6.0mm · 1.42mm/px · 1 of 34 slices shown]
[im 1/34]
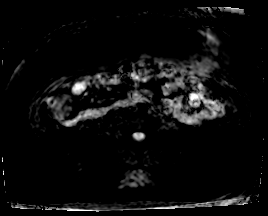

[Series 14: MRCP · coronal · 3.0mm · 1.12mm/px · 1 of 17 slices shown]
[im 1/17]
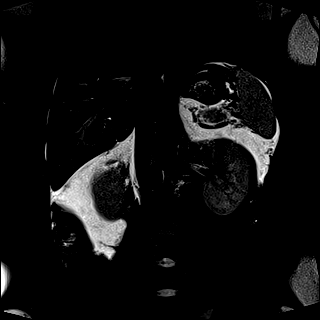

[Series 15: radials · coronal · 50.0mm · 0.78mm/px · 1 of 5 slices shown]
[im 1/5]
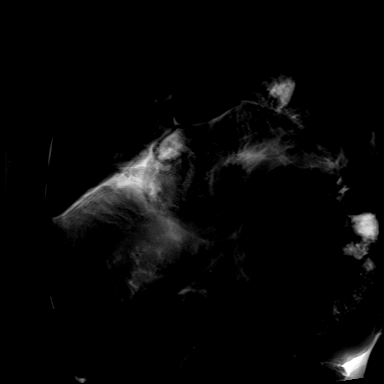

[Series 17: T1 dynamic fat-sat · axial · non-contrast · 3.0mm · 1.19mm/px · z∈[-41,+172]mm · 3 of 72 slices shown (1 of 5)]
[im 1/72]
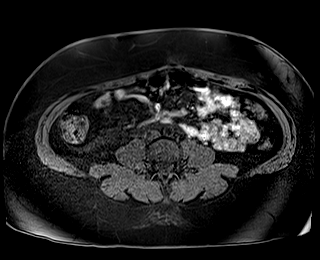
[im 36/72]
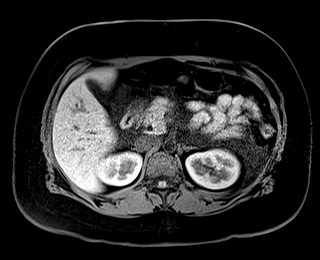
[im 72/72]
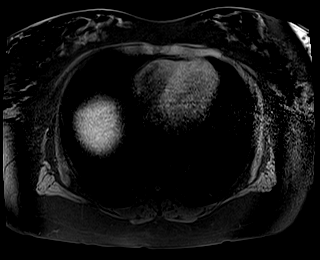

[Series 18: T1 dynamic fat-sat post-contrast · axial · 3.0mm · 1.19mm/px · z∈[-41,+172]mm · 3 of 72 slices shown (1 of 4)]
[im 1/72]
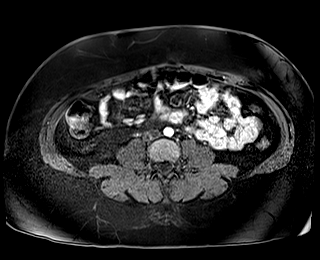
[im 36/72]
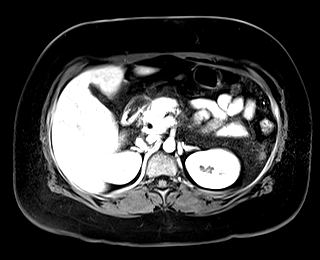
[im 72/72]
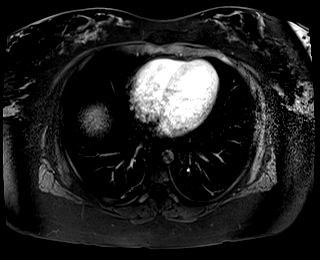

[Series 19: T1 dynamic fat-sat · axial · 3.0mm · 1.19mm/px · z∈[-41,+172]mm · 3 of 72 slices shown (2 of 5)]
[im 1/72]
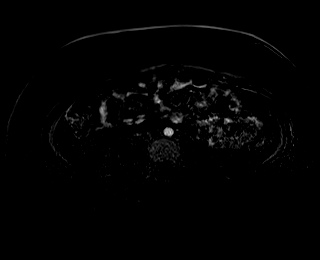
[im 36/72]
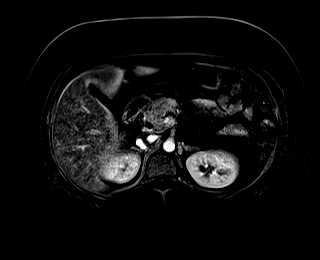
[im 72/72]
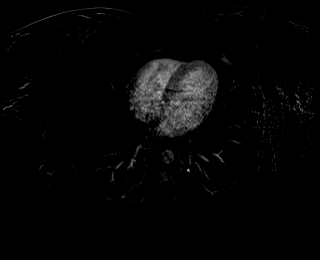

[Series 20: T1 dynamic fat-sat post-contrast · axial · 3.0mm · 1.19mm/px · z∈[-41,+172]mm · 3 of 72 slices shown (2 of 4)]
[im 1/72]
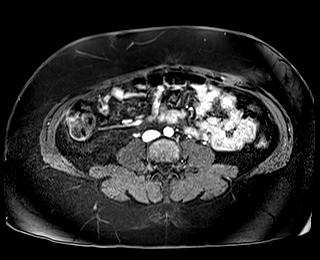
[im 36/72]
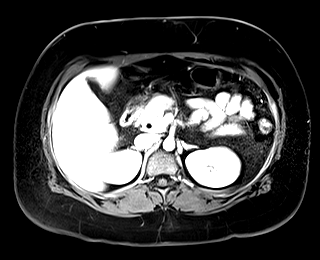
[im 72/72]
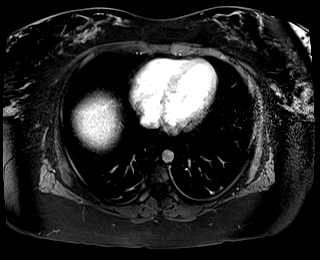

[Series 21: T1 dynamic fat-sat · axial · 3.0mm · 1.19mm/px · z∈[-41,+172]mm · 3 of 72 slices shown (3 of 5)]
[im 1/72]
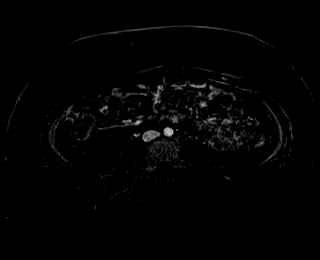
[im 36/72]
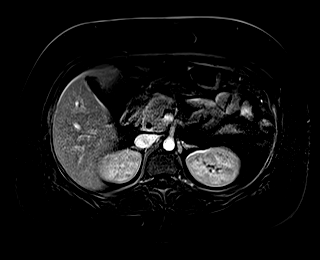
[im 72/72]
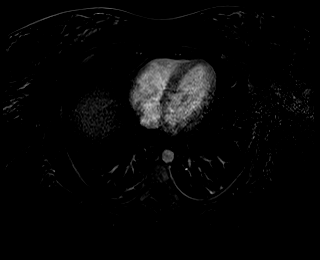

[Series 22: T1 dynamic fat-sat post-contrast · axial · 3.0mm · 1.19mm/px · z∈[-41,+172]mm · 3 of 72 slices shown (3 of 4)]
[im 1/72]
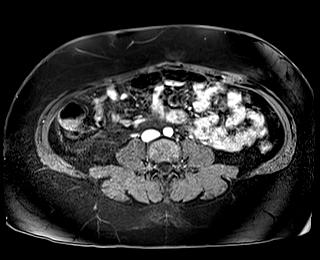
[im 36/72]
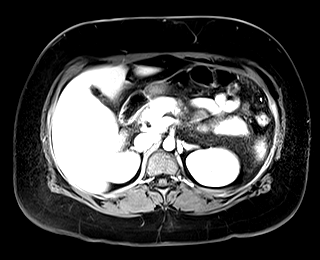
[im 72/72]
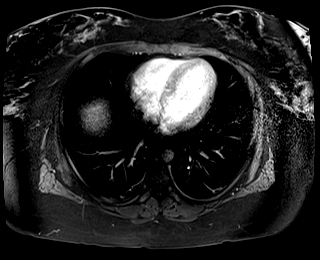

[Series 23: T1 dynamic fat-sat · axial · 3.0mm · 1.19mm/px · z∈[-41,+172]mm · 3 of 72 slices shown (4 of 5)]
[im 1/72]
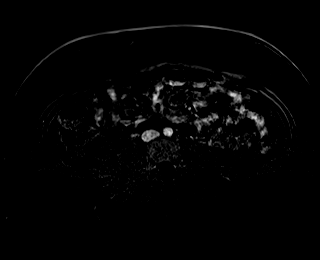
[im 36/72]
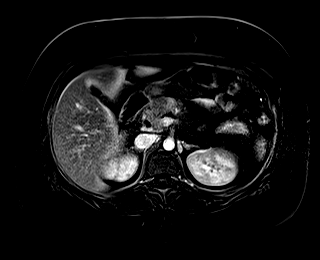
[im 72/72]
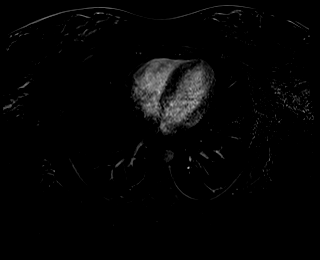

[Series 24: T1 dynamic post-contrast · coronal · 3.0mm · 1.31mm/px · 3 of 72 slices shown]
[im 1/72]
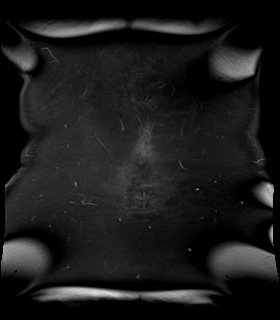
[im 36/72]
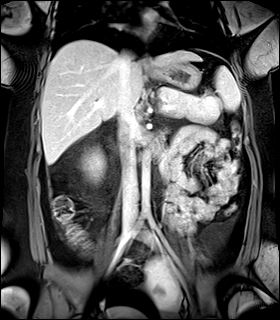
[im 72/72]
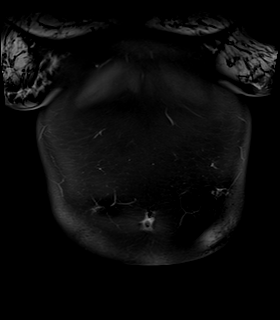

[Series 25: T1 dynamic fat-sat post-contrast · axial · 3.0mm · 1.19mm/px · z∈[-41,+172]mm · 3 of 72 slices shown (4 of 4)]
[im 1/72]
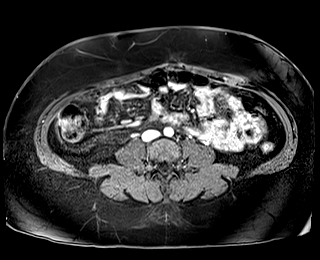
[im 36/72]
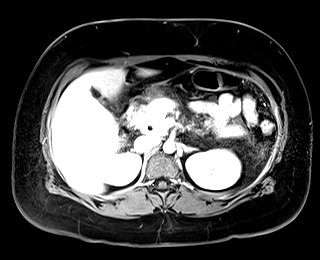
[im 72/72]
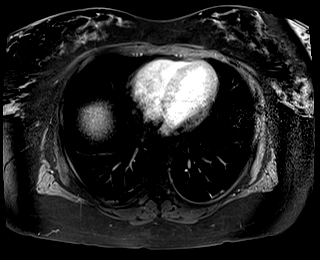

[Series 26: T1 dynamic fat-sat · axial · 3.0mm · 1.19mm/px · z∈[-41,+172]mm · 3 of 72 slices shown (5 of 5)]
[im 1/72]
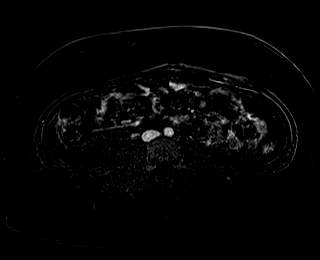
[im 36/72]
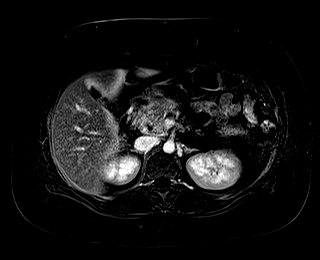
[im 72/72]
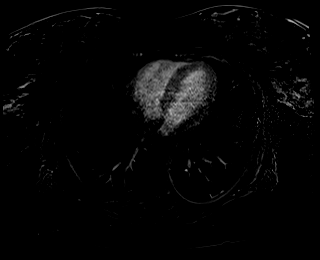

[46 of 48 positions shown; findings below may reference images not displayed]

FINDINGS: Lower chest: Normal heart size without pericardial or pleural
effusion.

Hepatobiliary: Normal liver. Interval cholecystectomy. No fluid
collection in the operative bed. No intra or extrahepatic biliary
duct dilatation. No evidence of choledocholithiasis and no correlate
for the questioned CT abnormality.

Pancreas: As on CT, development of moderate pancreatic edema and
marked peripancreatic fluid. No evidence of pancreatic necrosis,
duct dilatation, or well-circumscribed fluid collection.

Spleen:  Normal in size, without focal abnormality.

Adrenals/Urinary Tract: Normal adrenal glands. Normal kidneys,
without hydronephrosis.

Stomach/Bowel: Normal stomach and abdominal bowel loops.

Vascular/Lymphatic: Normal caliber of the aorta and branch vessels.
Patent portal and splenic veins.

Other: Trace perihepatic and perisplenic ascites are likely
reactive.

Musculoskeletal: No acute osseous abnormality.
IMPRESSION: Moderate non complicated pancreatitis.

Cholecystectomy without biliary duct dilatation or
choledocholithiasis.

## 2021-10-16 IMAGING — MR MR 3D RECON AT SCANNER
19 series · 19 of 19 positions shown · IV contrast (gadavist)
Comparison: CT of earlier today.  Abdominal MRI of yesterday

CLINICAL DATA: Status post robot assisted cholecystectomy 1 day ago
with CT demonstrating pancreatitis. Evaluate for common duct stone.

EXAM:
MRI ABDOMEN WITHOUT AND WITH CONTRAST (INCLUDING MRCP)
TECHNIQUE: Multiplanar multisequence MR imaging of the abdomen was performed
both before and after the administration of intravenous contrast.
Heavily T2-weighted images of the biliary and pancreatic ducts were
obtained, and three-dimensional MRCP images were rendered by post
processing.
CONTRAST:  7.5mL GADAVIST GADOBUTROL 1 MMOL/ML IV SOLN

[Series 3: T2 · coronal · 6.0mm · 1.19mm/px · 1 of 30 slices shown (1 of 2)]
[im 1/30]
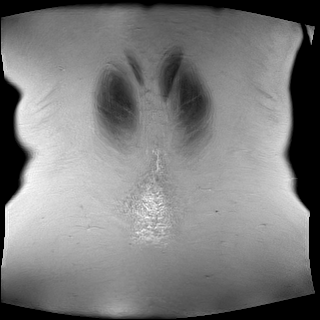

[Series 4: T2 · axial · 6.0mm · 1.19mm/px · 1 of 32 slices shown (2 of 2)]
[im 1/32]
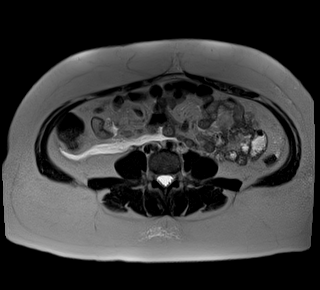

[Series 5: T1 · axial · 3.0mm · 1.19mm/px · 1 of 80 slices shown (1 of 2)]
[im 1/80]
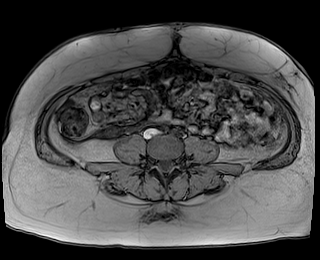

[Series 6: T1 · axial · 3.0mm · 1.19mm/px · 1 of 80 slices shown (2 of 2)]
[im 1/80]
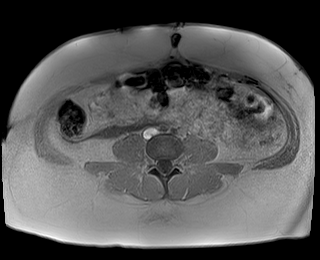

[Series 9: T2 fat-sat · axial · 6.0mm · 1.19mm/px · 1 of 34 slices shown]
[im 1/34]
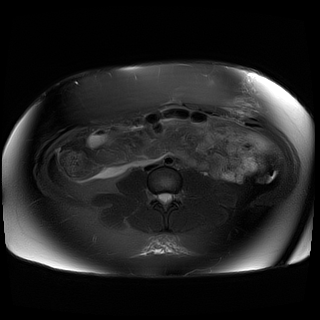

[Series 10: ax dwi_tracew · axial · 6.0mm · 1.42mm/px · 1 of 102 slices shown]
[im 1/102]
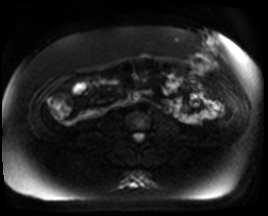

[Series 11: ax dwi_adc · axial · 6.0mm · 1.42mm/px · 1 of 34 slices shown]
[im 1/34]
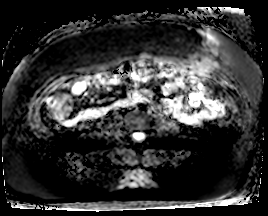

[Series 12: MRCP · coronal · 1.2mm · 0.49mm/px · 1 of 62 slices shown (1 of 2)]
[im 1/62]
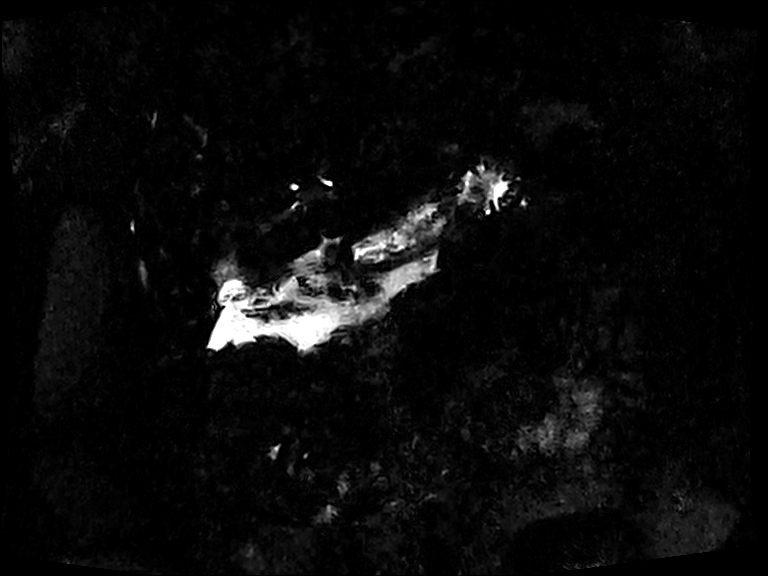

[Series 14: MRCP · coronal · 3.0mm · 1.12mm/px · 1 of 17 slices shown (2 of 2)]
[im 1/17]
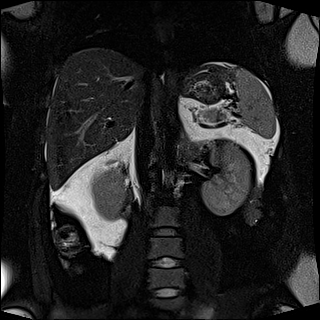

[Series 15: radials · coronal · 50.0mm · 0.78mm/px · 1 of 5 slices shown]
[im 1/5]
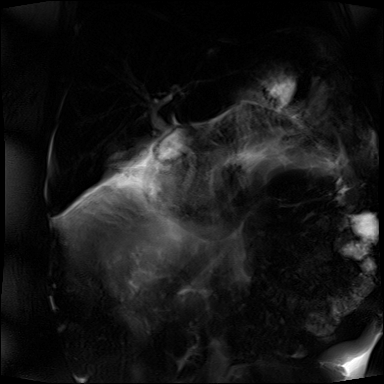

[Series 17: T1 dynamic fat-sat · axial · non-contrast · 3.0mm · 1.19mm/px · 1 of 72 slices shown (1 of 5)]
[im 1/72]
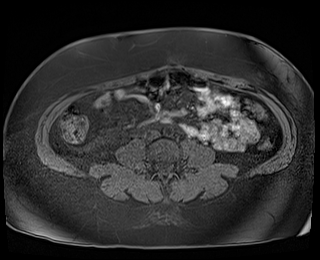

[Series 18: T1 dynamic fat-sat post-contrast · axial · 3.0mm · 1.19mm/px · 1 of 72 slices shown (1 of 3)]
[im 1/72]
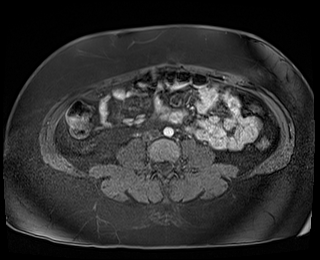

[Series 19: T1 dynamic fat-sat · axial · 3.0mm · 1.19mm/px · 1 of 72 slices shown (2 of 5)]
[im 1/72]
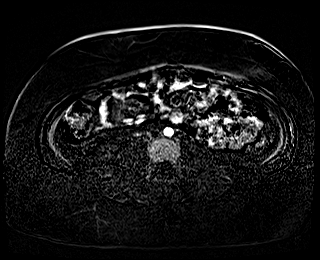

[Series 21: T1 dynamic fat-sat · axial · 3.0mm · 1.19mm/px · 1 of 72 slices shown (3 of 5)]
[im 1/72]
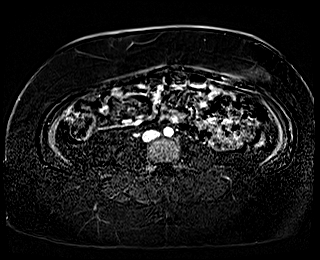

[Series 22: T1 dynamic fat-sat post-contrast · axial · 3.0mm · 1.19mm/px · 1 of 72 slices shown (2 of 3)]
[im 1/72]
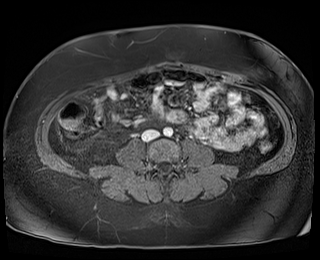

[Series 23: T1 dynamic fat-sat · axial · 3.0mm · 1.19mm/px · 1 of 72 slices shown (4 of 5)]
[im 1/72]
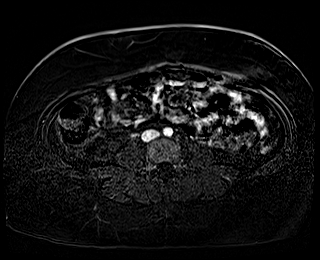

[Series 24: T1 dynamic post-contrast · coronal · 3.0mm · 1.31mm/px · 1 of 72 slices shown]
[im 1/72]
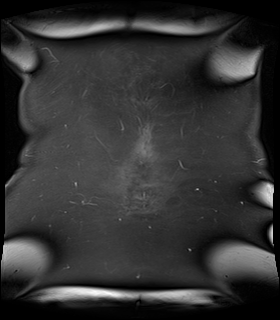

[Series 25: T1 dynamic fat-sat post-contrast · axial · 3.0mm · 1.19mm/px · 1 of 72 slices shown (3 of 3)]
[im 1/72]
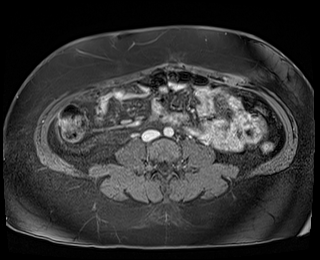

[Series 26: T1 dynamic fat-sat · axial · 3.0mm · 1.19mm/px · 1 of 72 slices shown (5 of 5)]
[im 1/72]
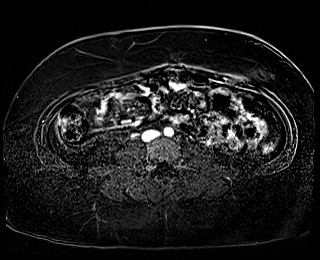

[19 of 19 positions shown; findings below may reference images not displayed]

FINDINGS: Lower chest: Normal heart size without pericardial or pleural
effusion.

Hepatobiliary: Normal liver. Interval cholecystectomy. No fluid
collection in the operative bed. No intra or extrahepatic biliary
duct dilatation. No evidence of choledocholithiasis and no correlate
for the questioned CT abnormality.

Pancreas: As on CT, development of moderate pancreatic edema and
marked peripancreatic fluid. No evidence of pancreatic necrosis,
duct dilatation, or well-circumscribed fluid collection.

Spleen:  Normal in size, without focal abnormality.

Adrenals/Urinary Tract: Normal adrenal glands. Normal kidneys,
without hydronephrosis.

Stomach/Bowel: Normal stomach and abdominal bowel loops.

Vascular/Lymphatic: Normal caliber of the aorta and branch vessels.
Patent portal and splenic veins.

Other: Trace perihepatic and perisplenic ascites are likely
reactive.

Musculoskeletal: No acute osseous abnormality.
IMPRESSION: Moderate non complicated pancreatitis.

Cholecystectomy without biliary duct dilatation or
choledocholithiasis.

## 2021-10-16 MED ORDER — IBUPROFEN 800 MG PO TABS: 800.0000 mg | ORAL_TABLET | Freq: Three times a day (TID) | ORAL | 0 refills | Status: AC | PRN

## 2021-10-16 MED ORDER — LACTATED RINGERS IV SOLN: INTRAVENOUS | Status: DC

## 2021-10-16 MED ORDER — TECHNETIUM TC 99M MEBROFENIN IV KIT
7.5000 | PACK | Freq: Once | INTRAVENOUS | Status: AC | PRN
  Administered 2021-10-16: 7.35 via INTRAVENOUS

## 2021-10-16 MED ORDER — IOHEXOL 300 MG/ML  SOLN
100.0000 mL | Freq: Once | INTRAMUSCULAR | Status: AC | PRN
  Administered 2021-10-16: 100 mL via INTRAVENOUS

## 2021-10-16 MED ORDER — KETOROLAC TROMETHAMINE 30 MG/ML IJ SOLN
30.0000 mg | Freq: Three times a day (TID) | INTRAMUSCULAR | Status: DC | PRN
  Administered 2021-10-16: 30 mg via INTRAVENOUS

## 2021-10-16 MED ORDER — ACETAMINOPHEN 325 MG PO TABS: 650.0000 mg | ORAL_TABLET | Freq: Three times a day (TID) | ORAL | 0 refills | Status: AC | PRN

## 2021-10-16 MED ORDER — GADOBUTROL 1 MMOL/ML IV SOLN
7.5000 mL | Freq: Once | INTRAVENOUS | Status: AC | PRN
  Administered 2021-10-16: 7.5 mL via INTRAVENOUS

## 2021-10-16 MED ORDER — HYDROCODONE-ACETAMINOPHEN 5-325 MG PO TABS: 1.0000 | ORAL_TABLET | Freq: Four times a day (QID) | ORAL | 0 refills | Status: AC | PRN

## 2021-10-16 MED ORDER — KETOROLAC TROMETHAMINE 30 MG/ML IJ SOLN
INTRAMUSCULAR | Status: AC
  Filled 2021-10-16: qty 1

## 2021-10-16 MED ORDER — DOCUSATE SODIUM 100 MG PO CAPS: 100.0000 mg | ORAL_CAPSULE | Freq: Two times a day (BID) | ORAL | 0 refills | Status: AC | PRN

## 2021-10-16 NOTE — Progress Notes (Addendum)
Subjective:  CC: Leslie Powell is a 26 y.o. female  Hospital stay day 0, 1 Day Post-Op robo lap chole  HPI: Unremarkable overnight, tolerated clears but report by RN of increased abdominal pain late am.  Currently comfortable after pain meds.  ROS:  General: Denies weight loss, weight gain, fatigue, fevers, chills, and night sweats. Heart: Denies chest pain, palpitations, racing heart, irregular heartbeat, leg pain or swelling, and decreased activity tolerance. Respiratory: Denies breathing difficulty, shortness of breath, wheezing, cough, and sputum. GI: Denies change in appetite, heartburn, nausea, vomiting, constipation, diarrhea, and blood in stool. GU: Denies difficulty urinating, pain with urinating, urgency, frequency, blood in urine.   Objective:   Temp:  [97 F (36.1 C)-98.5 F (36.9 C)] 98.3 F (36.8 C) (06/02 0900) Pulse Rate:  [68-89] 68 (06/02 0900) Resp:  [13-20] 14 (06/02 0900) BP: (119-136)/(81-88) 125/88 (06/02 0900) SpO2:  [96 %-100 %] 98 % (06/02 0900)     Height: 5\' 5"  (165.1 cm) Weight: 88.5 kg BMI (Calculated): 32.47   Intake/Output this shift:   Intake/Output Summary (Last 24 hours) at 10/16/2021 1522 Last data filed at 10/16/2021 1007 Gross per 24 hour  Intake 740 ml  Output 905 ml  Net -165 ml    Constitutional :  alert, cooperative, appears stated age, and no distress  Respiratory:  clear to auscultation bilaterally  Cardiovascular:  regular rate and rhythm  Gastrointestinal: Soft, mild guarding around incision sites, TTP around epigastric area .   Skin: Cool and moist. Incisions c/d/I. No icterus or jaundice on exam  Psychiatric: Normal affect, non-agitated, not confused       LABS:     Latest Ref Rng & Units 10/16/2021    7:20 AM 10/15/2021   12:19 AM 04/28/2021   10:45 AM  CMP  Glucose 70 - 99 mg/dL  04/30/2021   696    BUN 6 - 20 mg/dL  13   5    Creatinine 295 - 1.00 mg/dL  2.84   1.32    Sodium 135 - 145 mmol/L  141   135    Potassium  3.5 - 5.1 mmol/L  3.9   3.9    Chloride 98 - 111 mmol/L  108   101    CO2 22 - 32 mmol/L  26   21    Calcium 8.9 - 10.3 mg/dL  9.4   9.1    Total Protein 6.5 - 8.1 g/dL 7.2   7.1   6.5    Total Bilirubin 0.3 - 1.2 mg/dL 2.6   0.8   0.2    Alkaline Phos 38 - 126 U/L 128   84   51    AST 15 - 41 U/L 131   26   13    ALT 0 - 44 U/L 128   23   16        Latest Ref Rng & Units 10/16/2021    7:20 AM 10/15/2021   12:19 AM 04/28/2021   10:45 AM  CBC  WBC 4.0 - 10.5 K/uL 12.2   12.9   13.0    Hemoglobin 12.0 - 15.0 g/dL 04/30/2021   10.2   72.5    Hematocrit 36.0 - 46.0 % 39.3   37.9   38.5    Platelets 150 - 400 K/uL 400   380   350      RADS: HIDA negative for leak, possible obstruction vs stasis.  CT shows possible CBD stone, pending MRCP.  Fluid likely from recent cholecystectomy and bile leakeage intraop. Assessment:   S/p robo lap chole.  Uneventful case overall but has increased pain today as well as LFTs abnormality above.  HIDA as above, discussed specifically with radiologist MRCP vs CT.  He recommended CT first and CT results noted above.  So will proceed with MRCP  labs/images/medications/previous chart entries reviewed personally and relevant changes/updates noted above.

## 2021-10-16 NOTE — Discharge Instructions (Signed)
Laparoscopic Cholecystectomy, Care After This sheet gives you information about how to care for yourself after your procedure. Your doctor may also give you more specific instructions. If you have problems or questions, contact your doctor. Follow these instructions at home: Care for cuts from surgery (incisions)  Follow instructions from your doctor about how to take care of your cuts from surgery. Make sure you: Wash your hands with soap and water before you change your bandage (dressing). If you cannot use soap and water, use hand sanitizer. Change your bandage as told by your doctor. Leave stitches (sutures), skin glue, or skin tape (adhesive) strips in place. They may need to stay in place for 2 weeks or longer. If tape strips get loose and curl up, you may trim the loose edges. Do not remove tape strips completely unless your doctor says it is okay. Do not take baths, swim, or use a hot tub until your doctor says it is okay. OK TO SHOWER 24HRS AFTER YOUR SURGERY.  Check your surgical cut area every day for signs of infection. Check for: More redness, swelling, or pain. More fluid or blood. Warmth. Pus or a bad smell. Activity Do not drive or use heavy machinery while taking prescription pain medicine. Do not play contact sports until your doctor says it is okay. Do not drive for 24 hours if you were given a medicine to help you relax (sedative). Rest as needed. Do not return to work or school until your doctor says it is okay. General instructions  tylenol and advil as needed for discomfort.  Please alternate between the two every four hours as needed for pain.    Use narcotics, if prescribed, only when tylenol and motrin is not enough to control pain.  325-650mg every 8hrs to max of 3000mg/24hrs (including the 325mg in every norco dose) for the tylenol.    Advil up to 800mg per dose every 8hrs as needed for pain.   To prevent or treat constipation while you are taking prescription  pain medicine, your doctor may recommend that you: Drink enough fluid to keep your pee (urine) clear or pale yellow. Take over-the-counter or prescription medicines. Eat foods that are high in fiber, such as fresh fruits and vegetables, whole grains, and beans. Limit foods that are high in fat and processed sugars, such as fried and sweet foods. Contact a doctor if: You develop a rash. You have more redness, swelling, or pain around your surgical cuts. You have more fluid or blood coming from your surgical cuts. Your surgical cuts feel warm to the touch. You have pus or a bad smell coming from your surgical cuts. You have a fever. One or more of your surgical cuts breaks open. You have trouble breathing. You have chest pain. You have pain that is getting worse in your shoulders. You faint or feel dizzy when you stand. You have very bad pain in your belly (abdomen). You are sick to your stomach (nauseous) for more than one day. You have throwing up (vomiting) that lasts for more than one day. You have leg pain. This information is not intended to replace advice given to you by your health care provider. Make sure you discuss any questions you have with your health care provider. Document Released: 02/10/2008 Document Revised: 11/22/2015 Document Reviewed: 10/20/2015 Elsevier Interactive Patient Education  2019 [employer]  

## 2021-10-17 LAB — CBC
HCT: 39.3 % (ref 36.0–46.0)
Hemoglobin: 12.4 g/dL (ref 12.0–15.0)
MCH: 26.2 pg (ref 26.0–34.0)
MCHC: 31.6 g/dL (ref 30.0–36.0)
MCV: 82.9 fL (ref 80.0–100.0)
Platelets: 334 10*3/uL (ref 150–400)
RBC: 4.74 MIL/uL (ref 3.87–5.11)
RDW: 15.9 % — ABNORMAL HIGH (ref 11.5–15.5)
WBC: 11.7 10*3/uL — ABNORMAL HIGH (ref 4.0–10.5)
nRBC: 0 % (ref 0.0–0.2)

## 2021-10-17 LAB — HEPATIC FUNCTION PANEL
ALT: 136 U/L — ABNORMAL HIGH (ref 0–44)
AST: 77 U/L — ABNORMAL HIGH (ref 15–41)
Albumin: 3.2 g/dL — ABNORMAL LOW (ref 3.5–5.0)
Alkaline Phosphatase: 139 U/L — ABNORMAL HIGH (ref 38–126)
Bilirubin, Direct: 0.2 mg/dL (ref 0.0–0.2)
Indirect Bilirubin: 0.8 mg/dL (ref 0.3–0.9)
Total Bilirubin: 1 mg/dL (ref 0.3–1.2)
Total Protein: 6.4 g/dL — ABNORMAL LOW (ref 6.5–8.1)

## 2021-10-17 MED ORDER — HYDROCODONE-ACETAMINOPHEN 5-325 MG PO TABS: 2.0000 | ORAL_TABLET | Freq: Four times a day (QID) | ORAL | 0 refills | Status: AC | PRN

## 2021-10-17 NOTE — Progress Notes (Signed)
Pt was able to tolerate her regular meal for lunch with no GI symptom, no nausea or vomiting or abdominal pain reported. MD was informed and MD DC the pt. AVS was given and explained to pt, husband at bedside when teaching was done.

## 2021-10-17 NOTE — Discharge Summary (Signed)
Physician Discharge Summary  Patient ID: Leslie Powell MRN: [id number] DOB/AGE: 1995-12-19 26 y.o.  Admit date: 10/15/2021 Discharge date: 10/17/2021  Admission Diagnoses: Acute calculus cholecystitis  Discharge Diagnoses:  Principal Problem:   Acute cholecystitis Mild postoperative pancreatitis, clinically resolved at time of discharge.  Discharged Condition: good  Hospital Course: Admitted and underwent robotic cholecystectomy.  Had more than the expected postoperative pain on June 2, MRCP confirmed mild pancreatitis with no other abnormality appreciated.  Labs improved on June 3, patient tolerated regular diet and wanted to go home.  Pain well controlled now.  Consults: None  Significant Diagnostic Studies: radiology: MRI: MRCP, see report.  Treatments: surgery: Robotic cholecystectomy.  Discharge Exam: Blood pressure 116/80, pulse 76, temperature 99.1 F (37.3 C), resp. rate 19, height 5\' 5"  (1.651 m), weight 88.5 kg, last menstrual period 01/25/2021, SpO2 97 %, currently breastfeeding. General appearance: alert, cooperative, and no distress Eyes: conjunctivae/corneas clear. PERRL, EOM's intact. Fundi benign. Resp: clear to auscultation bilaterally GI: soft, non-tender; bowel sounds normal; no masses,  no organomegaly Incision/Wound: Clean/dry and intact.  Disposition: Discharge disposition: 01-Home or Self Care       Discharge Instructions     Call MD for:  persistant nausea and vomiting   Complete by: As directed    Call MD for:  redness, tenderness, or signs of infection (pain, swelling, redness, odor or green/yellow discharge around incision site)   Complete by: As directed    Call MD for:  severe uncontrolled pain   Complete by: As directed    Diet - low sodium heart healthy   Complete by: As directed    Discharge wound care:   Complete by: As directed    Your incision was closed with Dermabond.  It is best to keep it clean and dry, it will tolerate  a brief shower, but do not soak it or apply any creams or lotions to the incisions.  The Dermabond should gradually flake off over time.  Keep it open to air so you can evaluate your incisions.  Dermabond assists the underlying sutures to keep your incision closed and protected from infection.  Should you develop some drainage from your incision, some drops of drainage would be okay but if it persists continue to put keep a dry dressing over it.   Driving Restrictions   Complete by: As directed    No driving until cleared after follow-up appointment.  Is not advised to drive while taking narcotic pain medications or in significant pain.   Increase activity slowly   Complete by: As directed    Lifting restrictions   Complete by: As directed    Strongly advised against any form of lifting greater than 15 pounds over the next 4 to 6 weeks.  This involves pushing/pulling movements as well.  After 4 weeks when may gradually engage in more activities remaining aware of any new pain/tenderness elicited, and avoiding those for the full duration of 6 weeks.  Walking is encouraged.  Climbing stairs with caution.      Allergies as of 10/17/2021   No Known Allergies      Medication List     STOP taking these medications    Flintstones Multivitamin Chew       TAKE these medications    acetaminophen 325 MG tablet Commonly known as: Tylenol Take 2 tablets (650 mg total) by mouth every 8 (eight) hours as needed for mild pain.   docusate sodium 100 MG capsule Commonly known as:  Colace Take 1 capsule (100 mg total) by mouth 2 (two) times daily as needed for up to 10 days for mild constipation.   HYDROcodone-acetaminophen 5-325 MG tablet Commonly known as: Norco Take 1 tablet by mouth every 6 (six) hours as needed for up to 6 doses for moderate pain.   ibuprofen 800 MG tablet Commonly known as: ADVIL Take 1 tablet (800 mg total) by mouth every 8 (eight) hours as needed for mild pain or moderate  pain. What changed:  medication strength how much to take when to take this reasons to take this   labetalol 300 MG tablet Commonly known as: NORMODYNE Take 1 tablet by mouth in the morning and at bedtime.               Discharge Care Instructions  (From admission, onward)           Start     Ordered   10/17/21 0000  Discharge wound care:       Comments: Your incision was closed with Dermabond.  It is best to keep it clean and dry, it will tolerate a brief shower, but do not soak it or apply any creams or lotions to the incisions.  The Dermabond should gradually flake off over time.  Keep it open to air so you can evaluate your incisions.  Dermabond assists the underlying sutures to keep your incision closed and protected from infection.  Should you develop some drainage from your incision, some drops of drainage would be okay but if it persists continue to put keep a dry dressing over it.   10/17/21 1616            Follow-up Information     Sakai, Isami, DO Follow up in 2 week(s).   Specialty: Surgery Why: post op lap chole Contact information: [address] [place] [address] [phone]                 Signed: Ronny Bacon, M.D., [facility] 10/17/2021, 4:17 PM

## 2021-10-19 LAB — SURGICAL PATHOLOGY

## 2021-11-27 ENCOUNTER — Telehealth: Payer: Self-pay | Admitting: Family Medicine

## 2021-11-27 NOTE — Telephone Encounter (Signed)
Called pt to schedule PP appt. Pt states she just got new insurance and she is looking for a provider that accepts her insurance. She stated that plans to go to the new provider for her pp visit. Pt states that she will call us back to schedule a pp visit if she is unable to find a new provider.
